# Patient Record
Sex: Male | Born: 1975 | Race: White | Hispanic: No | Marital: Married | Smoking: Light tobacco smoker
Health system: Southern US, Community
[De-identification: ages and names within clinical notes are randomized; demographics above are authoritative.]

## PROBLEM LIST (undated history)

## (undated) DIAGNOSIS — K429 Umbilical hernia without obstruction or gangrene: Secondary | ICD-10-CM

## (undated) DIAGNOSIS — T7840XA Allergy, unspecified, initial encounter: Secondary | ICD-10-CM

## (undated) DIAGNOSIS — R51 Headache: Secondary | ICD-10-CM

## (undated) DIAGNOSIS — E0590 Thyrotoxicosis, unspecified without thyrotoxic crisis or storm: Secondary | ICD-10-CM

## (undated) DIAGNOSIS — E0500 Thyrotoxicosis with diffuse goiter without thyrotoxic crisis or storm: Secondary | ICD-10-CM

## (undated) DIAGNOSIS — E119 Type 2 diabetes mellitus without complications: Secondary | ICD-10-CM

## (undated) DIAGNOSIS — R519 Headache, unspecified: Secondary | ICD-10-CM

## (undated) DIAGNOSIS — R011 Cardiac murmur, unspecified: Secondary | ICD-10-CM

## (undated) DIAGNOSIS — F419 Anxiety disorder, unspecified: Secondary | ICD-10-CM

## (undated) DIAGNOSIS — I10 Essential (primary) hypertension: Secondary | ICD-10-CM

## (undated) DIAGNOSIS — R454 Irritability and anger: Secondary | ICD-10-CM

## (undated) DIAGNOSIS — G4730 Sleep apnea, unspecified: Secondary | ICD-10-CM

## (undated) HISTORY — DX: Essential (primary) hypertension: I10

## (undated) HISTORY — DX: Type 2 diabetes mellitus without complications: E11.9

## (undated) HISTORY — DX: Thyrotoxicosis, unspecified without thyrotoxic crisis or storm: E05.90

## (undated) HISTORY — DX: Thyrotoxicosis with diffuse goiter without thyrotoxic crisis or storm: E05.00

## (undated) HISTORY — DX: Anxiety disorder, unspecified: F41.9

## (undated) HISTORY — DX: Headache, unspecified: R51.9

## (undated) HISTORY — DX: Cardiac murmur, unspecified: R01.1

## (undated) HISTORY — DX: Headache: R51

## (undated) HISTORY — DX: Irritability and anger: R45.4

## (undated) HISTORY — DX: Umbilical hernia without obstruction or gangrene: K42.9

## (undated) HISTORY — DX: Allergy, unspecified, initial encounter: T78.40XA

## (undated) HISTORY — PX: NO PAST SURGERIES: SHX2092

---

## 2012-09-24 LAB — TSH: TSH: 0 u[IU]/mL — AB (ref <=5.90)

## 2014-07-13 LAB — TSH: TSH: 0 u[IU]/mL — AB (ref 0.41–5.90)

## 2014-08-12 ENCOUNTER — Encounter: Payer: Self-pay | Admitting: Internal Medicine

## 2014-10-08 ENCOUNTER — Encounter: Payer: Self-pay | Admitting: Endocrinology

## 2014-10-08 ENCOUNTER — Ambulatory Visit (INDEPENDENT_AMBULATORY_CARE_PROVIDER_SITE_OTHER): Payer: 59 | Admitting: Endocrinology

## 2014-10-08 VITALS — BP 138/76 | HR 76 | Wt 218.5 lb

## 2014-10-08 DIAGNOSIS — I10 Essential (primary) hypertension: Secondary | ICD-10-CM | POA: Insufficient documentation

## 2014-10-08 DIAGNOSIS — E0590 Thyrotoxicosis, unspecified without thyrotoxic crisis or storm: Secondary | ICD-10-CM

## 2014-10-08 DIAGNOSIS — I152 Hypertension secondary to endocrine disorders: Secondary | ICD-10-CM | POA: Insufficient documentation

## 2014-10-08 NOTE — Progress Notes (Signed)
Pre visit review using our clinic review tool, if applicable. No additional management support is needed unless otherwise documented below in the visit note. 

## 2014-10-08 NOTE — Progress Notes (Signed)
Reason for visit: Low TSH/hyperthyroidism HPI  Edward Hurley is a 38 y.o.-year-old male, referred by his PCP,  Edward Hurley,Edward Hurley, Edward Hurley, for evaluation for hyperthyroidism/low TSH.   The patient denies any prior  family history of thyroid disorders. he denies any personal XRT exposure to his neck area. Patient denies any recent hospitalization, illness or major change in his medical history. No recent medication changes. Denies tenderness in neck area.  Denies any recent ingestion of seaweed/kelp or recent exposure to iv contrast dye. No recent steroid use or use of herbal supplements.   He gives about a 2 year history since 2013 of having symptoms related to the thyroid. About December 2013, he [facility] endocrine [facility], but "they messed up my insurance" and hasn't been back to see them. He reports being symptomatic with unintentional weight loss, tremors at that time for a period of close to 3 months. Labs were consistent with hyperthyroidism and he was started on methimazole by endocrine. Took that for about 1 month or so, but didn't go back for follow up. Hasn't been on medication/methimazole for the past 2 years. Recently went to see PCP, and screening TSH was abnormal.  Don't have those reports for review.  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    I reviewed pt's thyroid tests: No results found for: TSH, FREET4  Other pertinent labs are as follows: No results found for: AST No results found for: ALT  Review of systems:  [ x ] complains of   [  ] denies  [  ] weight loss [ x ]  Tremors, contd but better [  ] palpitations  [  ] diarrhea [  ] increased anxiety [  ] muscle weakness  [  ] heat intolerance [  ] fatigue  [  ] proptosis [  ] problems with eye closure or color vision. [  ] noticing any enlargement in size of thyroid [  ] lumps in neck [  ] dysphagia  [  ] change in voice [  ]  SOB when lying down   I reviewed his chart and he also has a history of HTN for which he is on  Atenolol. I have reviewed the patient's past medical history, family and social history, surgical history, medications and allergies.  Past Medical History  Diagnosis Date  . Hypertension   . Irritability    No past surgical history on file. Family History  Problem Relation Age of Onset  . Thyroid disease Neg Hx    History   Social History  . Marital Status: Married    Spouse Name: N/A    Number of Children: N/A  . Years of Education: N/A   Occupational History  . Not on file.   Social History Main Topics  . Smoking status: Current Some Day Smoker  . Smokeless tobacco: Not on file  . Alcohol Use: 0.0 oz/week    0 Not specified per week  . Drug Use: No  . Sexual Activity: Not on file   Other Topics Concern  . Not on file   Social History Narrative  . No narrative on file   No current outpatient prescriptions on file prior to visit.   No current facility-administered medications on file prior to visit.   No Known Allergies     ROS: Review of Systems: [x]  complains of  [  ] denies General:   [  ] Recent weight change [  ] Fatigue  [  ]  Loss of appetite Eyes: [  ]  Vision Difficulty [  ]  Eye pain ENT: [  ]  Hearing difficulty [  ]  Difficulty Swallowing CVS: [  ] Chest pain [  ]  Palpitations/Irregular Heart beat [  ]  Shortness of breath lying flat [  ] Swelling of legs Resp: [  ] Frequent Cough [  ] Shortness of Breath  [  ]  Wheezing GI: [  ] Heartburn  [  ] Nausea or Vomiting  [  ] Diarrhea [  ] Constipation  [  ] Abdominal Pain GU: [  ]  Polyuria  [  ]  nocturia Bones/joints:  [  ]  Muscle aches  [  ] Joint Pain  [  ] Bone pain Skin/Hair/Nails: [  ]  Rash  [  ] New stretch marks [  ]  Itching [  ] Hair loss [  ]  Excessive hair growth Reproduction: [  ] Low sexual desire , [  ]  Women: Menstrual cycle problems [  ]  Women: Breast Discharge [  ] Men: Difficulty with erections [  ]  Men: Enlarged Breasts CNS: [  ] Frequent Headaches [  ] Blurry vision [x  ]  Tremors [  ] Seizures [  ] Loss of consciousness [  ] Localized weakness Endocrine: [  ]  Excess thirst [  ]  Feeling excessively hot [  ]  Feeling excessively cold Heme: [  ]  Easy bruising [  ]  Enlarged glands or lumps in neck Allergy: [  ]  Food allergies [ x ] Environmental allergies  PE: BP 138/76 mmHg  Pulse 76  Wt 218 lb 8 oz (99.111 kg)  SpO2 97% Wt Readings from Last 3 Encounters:  10/08/14 218 lb 8 oz (99.111 kg)    HEENT: [place]/AT, EOMI, no icterus, no proptosis, no chemosis, no mild lid lag, no retraction, eyes close completely Neck: thyroid gland - smooth with some minor irregularity, non-tender, no erythema, no tracheal deviation; negative Pemberton's sign; no lymphadenopathy; no bruits Lungs: good air entry, clear bilaterally Heart: S1&S2 normal, regular rate & rhythm; no murmurs, rubs or gallops Abd: soft, NT, ND, no HSM, +BS Ext: min tremor in hands bilaterally, no edema, 2+ DP/PT pulses, good muscle mass Neuro: normal gait, 2+ reflexes bilaterally, normal 5/5 strength, no proximal myopathy  Derm: no pretibial myxoedema/skin dryness   ASSESSMENT: 1. Low TSH/Hyperthyroidism   PLAN:    Problem List Items Addressed This Visit      Endocrine   Hyperthyroidism - Primary    Discussed with the patient regarding thyroid hormone physiology, symptoms and signs of hyperthyroidism. Discussed possible etiologies for low TSH could indicate early subclinical hyperthyroidism due to Graves disease versus toxic adenoma or toxic MNG versus transient changes due to thyroiditis.   Will obtain prior thyroid records and referral notes from PCP.  Will get a thyroid panel today with thyroid antibody levels.   On exam today he appears to be clinically euthyroid/slightly overactive without a goiter/palpable nodules. his heart rate is well controlled today, will hold off on starting beta blocker for now.   If labs are consistent with subclinical hyperthyroidism- then will continue to  monitor for symptoms at this time.  If labs show frank hyperthyroidism- then will conduct further workup with diagnostic nuclear scan/thyroid US and consider starting the patient back on methimazole.   We discussed the diagnostic workup and treatment options for hyperthyroidism at this  visit.   RTC 2 months    Relevant Medications      atenolol (TENORMIN) 100 MG tablet   Other Relevant Orders      Thyroid stimulating immunoglobulin      Thyroid Profile II      Hepatic function panel      T4, free      T3, free      - I advised him to join my chart to communicate easier - RTC in 2 months or sooner if symptoms worsen.     Marke Goodwyn [facility] 10/08/2014  3:21 PM

## 2014-10-08 NOTE — Patient Instructions (Signed)
Labs today.  Monitor for symptoms of hyperthyroidism discussed in clinic.   Heart rate well controlled today.  Please come back for a follow-up appointment in 2 months.

## 2014-10-08 NOTE — Assessment & Plan Note (Signed)
Discussed with the patient regarding thyroid hormone physiology, symptoms and signs of hyperthyroidism. Discussed possible etiologies for low TSH could indicate early subclinical hyperthyroidism due to Graves disease versus toxic adenoma or toxic MNG versus transient changes due to thyroiditis.   Will obtain prior thyroid records and referral notes from PCP.  Will get a thyroid panel today with thyroid antibody levels.   On exam today he appears to be clinically euthyroid/slightly overactive without a goiter/palpable nodules. his heart rate is well controlled today, will hold off on starting beta blocker for now.   If labs are consistent with subclinical hyperthyroidism- then will continue to monitor for symptoms at this time.  If labs show frank hyperthyroidism- then will conduct further workup with diagnostic nuclear scan/thyroid US and consider starting the patient back on methimazole.   We discussed the diagnostic workup and treatment options for hyperthyroidism at this visit.   RTC 2 months

## 2014-10-09 ENCOUNTER — Other Ambulatory Visit: Payer: Self-pay | Admitting: Endocrinology

## 2014-10-09 DIAGNOSIS — E0590 Thyrotoxicosis, unspecified without thyrotoxic crisis or storm: Secondary | ICD-10-CM

## 2014-10-09 LAB — T4, FREE: Free T4: 2.86 ng/dL — ABNORMAL HIGH (ref 0.60–1.60)

## 2014-10-09 LAB — THYROID PROFILE II
Free Thyroxine Index: 6 — ABNORMAL HIGH (ref 1.2–4.9)
T3 Uptake Ratio: 39 % (ref 24–39)
T3, Total: 267 ng/dL — ABNORMAL HIGH (ref 71–180)
T4, Total: 15.5 ug/dL — ABNORMAL HIGH (ref 4.5–12.0)
TSH: 0.005 u[IU]/mL — ABNORMAL LOW (ref 0.450–4.500)

## 2014-10-09 LAB — HEPATIC FUNCTION PANEL
ALBUMIN: 4.5 g/dL (ref 3.5–5.2)
ALK PHOS: 69 U/L (ref 39–117)
ALT: 40 U/L (ref 0–53)
AST: 25 U/L (ref 0–37)
Bilirubin, Direct: 0.1 mg/dL (ref 0.0–0.3)
TOTAL PROTEIN: 7.2 g/dL (ref 6.0–8.3)
Total Bilirubin: 0.7 mg/dL (ref 0.2–1.2)

## 2014-10-09 LAB — T3, FREE: T3, Free: 7.2 pg/mL — ABNORMAL HIGH (ref 2.3–4.2)

## 2014-10-10 ENCOUNTER — Other Ambulatory Visit: Payer: Self-pay | Admitting: Endocrinology

## 2014-10-10 ENCOUNTER — Encounter: Payer: Self-pay | Admitting: Endocrinology

## 2014-10-10 DIAGNOSIS — E0590 Thyrotoxicosis, unspecified without thyrotoxic crisis or storm: Secondary | ICD-10-CM

## 2014-10-10 DIAGNOSIS — J309 Allergic rhinitis, unspecified: Secondary | ICD-10-CM | POA: Insufficient documentation

## 2014-10-10 DIAGNOSIS — F419 Anxiety disorder, unspecified: Secondary | ICD-10-CM

## 2014-10-10 DIAGNOSIS — F329 Major depressive disorder, single episode, unspecified: Secondary | ICD-10-CM | POA: Insufficient documentation

## 2014-10-10 DIAGNOSIS — F32A Depression, unspecified: Secondary | ICD-10-CM | POA: Insufficient documentation

## 2014-10-11 LAB — THYROID STIMULATING IMMUNOGLOBULIN: TSI: 242 %{baseline} — AB (ref <140)

## 2014-10-24 ENCOUNTER — Telehealth: Payer: Self-pay | Admitting: Endocrinology

## 2014-10-24 NOTE — Telephone Encounter (Signed)
error 

## 2014-11-07 ENCOUNTER — Telehealth: Payer: Self-pay

## 2014-11-07 NOTE — Telephone Encounter (Signed)
-----   Message from Quentin Cornwalladhika P Phadke, MD sent at 11/06/2014 12:57 PM EST ----- Regarding: FW: fu nuc scan Could you call to see whether he got his nuclear scan- ideally would like to have the results before leaving for vacation. thanks ----- Message -----    From: Quentin Cornwalladhika P Phadke, MD    Sent: 10/09/2014   9:47 AM      To: Quentin Cornwalladhika P Phadke, MD Subject: fu nuc scan

## 2014-11-07 NOTE — Telephone Encounter (Signed)
Patient was scheduled to have it done 10/25/14. Patient did not show up for appointment and has not called to reschedule the appointment yet.

## 2014-12-03 ENCOUNTER — Encounter: Payer: Self-pay | Admitting: Endocrinology

## 2014-12-10 ENCOUNTER — Ambulatory Visit: Payer: Self-pay | Admitting: Endocrinology

## 2014-12-11 ENCOUNTER — Telehealth: Payer: Self-pay

## 2014-12-11 NOTE — Telephone Encounter (Signed)
Called pt to follow up on missed apt, no answer, lvmom (X2)

## 2014-12-11 NOTE — Telephone Encounter (Signed)
Lets wait for this week and see whether he keeps up with his nuc med appointment. If he doesn't keep this appointment, then I will talk to him regarding whether he wishes to follow up.

## 2014-12-12 ENCOUNTER — Ambulatory Visit: Payer: Self-pay | Admitting: Endocrinology

## 2014-12-12 NOTE — Telephone Encounter (Signed)
Please could you on Monday check with [facility] Nuc med to see if the patient made it to his nuc med diagnostic scan for his thyroid.

## 2014-12-16 NOTE — Telephone Encounter (Signed)
Spoke to patient to schedule a follow up appt. Patient stated that he was at work and unable to schedule the appointment right now. Patient stated she would call tomorrow jto schedule lab appt and/or follow up this week.

## 2014-12-16 NOTE — Telephone Encounter (Signed)
Patient schedule Wednesday at 11:15am for follow up

## 2014-12-16 NOTE — Telephone Encounter (Signed)
Patient did have his nuc scan done. Results have been placed in your box for review.

## 2014-12-16 NOTE — Telephone Encounter (Signed)
Great, could you bring him back for an appt for me as well- preferably this week. If he cant come in this week, I would like him to atleast have repeat labs this week and follow up next week ( Thyroid profile II, free T3, free T4, Liver panel) Dx hyperthyroidism. I will review the Nuc scan results tomorrow and let him know of the results.   Thanks

## 2014-12-17 ENCOUNTER — Telehealth: Payer: Self-pay

## 2014-12-17 NOTE — Telephone Encounter (Signed)
Test results given to Dr. Welford RochePhadke. Awaiting result note at this time.

## 2014-12-17 NOTE — Telephone Encounter (Signed)
Notified patient of results and Dr. Ephriam JenkinsPhadke's comments. Patient verbalized understanding.

## 2014-12-17 NOTE — Telephone Encounter (Signed)
Called patient, no answer. Left voicemail requesting a callback as soon as possible to discuss results.

## 2014-12-17 NOTE — Telephone Encounter (Signed)
The patient called hoping to get a return call regarding his test results.  (He has an apt scheduled for tomorrow)

## 2014-12-17 NOTE — Telephone Encounter (Signed)
Spoke to patient to notify him of dr.Phadke's comments. Patient verbalized understanding and stated that he would be here for his appt tomorrow.

## 2014-12-17 NOTE — Telephone Encounter (Signed)
Reviewed his Nuc Scan results done [facility] on 12/13/14-  4 hour uptake elevated at 81% 24 hour uptake elevated at 86%  Normal homogenous tracer distribution throughout both thyroid lobes. No focal areas of increased or decreased tracer localization.   Impression- This indicates that he has Graves' disease ( autoimmune thyroid disorder where the body's immunity causes the thyroid to continue to overwork). I would like him to keep his appointment tomorrow with me so that we could review treatment options and get labs tomorrow.   Please could you let him know of the above.  Thanks

## 2014-12-17 NOTE — Telephone Encounter (Signed)
Called patient, no answer. Left voicemail requesting a callback to discuss results.

## 2014-12-18 ENCOUNTER — Ambulatory Visit (INDEPENDENT_AMBULATORY_CARE_PROVIDER_SITE_OTHER): Payer: Managed Care, Other (non HMO) | Admitting: Endocrinology

## 2014-12-18 ENCOUNTER — Encounter: Payer: Self-pay | Admitting: Endocrinology

## 2014-12-18 VITALS — BP 122/62 | HR 90 | Ht 72.0 in | Wt 211.0 lb

## 2014-12-18 DIAGNOSIS — E0590 Thyrotoxicosis, unspecified without thyrotoxic crisis or storm: Secondary | ICD-10-CM

## 2014-12-18 LAB — HEPATIC FUNCTION PANEL
ALBUMIN: 4.2 g/dL (ref 3.5–5.2)
ALK PHOS: 80 U/L (ref 39–117)
ALT: 33 U/L (ref 0–53)
AST: 25 U/L (ref 0–37)
Bilirubin, Direct: 0.1 mg/dL (ref 0.0–0.3)
TOTAL PROTEIN: 6.9 g/dL (ref 6.0–8.3)
Total Bilirubin: 0.5 mg/dL (ref 0.2–1.2)

## 2014-12-18 LAB — T3, FREE: T3, Free: 21.2 pg/mL — ABNORMAL HIGH (ref 2.3–4.2)

## 2014-12-18 LAB — T4, FREE: FREE T4: 4.65 ng/dL — AB (ref 0.60–1.60)

## 2014-12-18 MED ORDER — ATENOLOL 25 MG PO TABS: ORAL_TABLET | ORAL | Status: DC

## 2014-12-18 NOTE — Patient Instructions (Signed)
Labs today, following this will start methimazole for hyperthyroidism. Notify me if you start to have fever/sore throat/rash/skin yellowing while on this medication.   Increase atenolol to 125 mg daily.   Please come back for a follow-up appointment in 6 weeks

## 2014-12-18 NOTE — Assessment & Plan Note (Signed)
He has uncontrolled hyperthyroidism from Graves'  Update thyroid panel today to assess trend.  Adjusted his beta blocker to 125 mg daily. He is aware to notify me if he does not tolerate the increased dose of BB due to hypotension , bradycardia.   Discussed at length regarding definitive treatment for hyperthyroidism ( methimazole/PTU vs RAI vs surgery). Discussed pros and cons at length. Discussed common side effects and expected response.   He has elected to start methimazole - will base dose on his labs from today. He will notify me if he doesn't tolerate or develops any side effects from methimazole.  Also discussed about Grave's opthalmopathy and increased risk in smokers. Discussed about smoking cessation briefly.   RTC 6 weeks

## 2014-12-18 NOTE — Progress Notes (Signed)
Reason for visit: Low TSH/hyperthyroidism HPI  Edward Hurley is a 39 y.o.-year-old male, here for follow up of hyperthyroidism/low TSH.   He gives about a 2 year history since 2013 of having symptoms related to the thyroid. About December 2013, he saw endocrine [facility] when he was symptomatic with unintentional weight loss, tremors at that time for a period of close to 3 months. Labs were consistent with hyperthyroidism and he was started on methimazole by endocrine. Took that for about 1 month or so, but didn't go back for follow up. Hasn't been on medication/methimazole for the past 2 years.  Went to see PCP in 2015, and screening TSH was abnormal.  Follow up testing in Nov 2015 showed continued hyperthyroidism, with elevated TSI levels.  He scheduled and rescheduled appointments for diagnostic nuclear scan and finally got the test done in jan 2016 that was consistent with increased 4 and 24 hour uptake, which was homogenous throughout the thyroid lobes c/w Graves' . Recent results d/w pt today.  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    I reviewed pt's thyroid tests: Lab Results  Component Value Date   TSH 0.005* 10/08/2014   TSH 0.00* 07/13/2014   TSH 0.00* 09/24/2012   FREET4 2.86* 10/08/2014    Lab Results  Component Value Date   T3FREE 7.2* 10/08/2014    Other pertinent labs are as follows: Lab Results  Component Value Date   AST 25 10/08/2014   Lab Results  Component Value Date   ALT 40 10/08/2014    Review of systems:  [ x ] complains of   [  ] denies  [ x ] weight loss [ x ]  Tremors, contd but better [  ] palpitations  [  ] diarrhea [  ] increased anxiety [  ] muscle weakness  [ x ] heat intolerance [  ] fatigue  [  ] proptosis [  ] problems with eye closure or color vision. [  ] noticing any enlargement in size of thyroid [  ] lumps in neck [  ] dysphagia  [  ] change in voice [  ]  SOB when lying down  Still smoking 1 cig/day. No leg  swelling. Continues to beta blocker and tolerating well.   I reviewed his chart and he also has a history of HTN for which he is on Atenolol. I have reviewed the patient's past medical history,  medications and allergies.   Current Outpatient Prescriptions on File Prior to Visit  Medication Sig Dispense Refill  . atenolol (TENORMIN) 100 MG tablet Take 100 mg by mouth daily.    . sertraline (ZOLOFT) 100 MG tablet Take 100 mg by mouth daily.     No current facility-administered medications on file prior to visit.   No Known Allergies  PE: BP 122/62 mmHg  Pulse 90  Ht 6' (1.829 m)  Wt 211 lb (95.709 kg)  BMI 28.61 kg/m2  SpO2 97% Wt Readings from Last 3 Encounters:  12/18/14 211 lb (95.709 kg)  10/08/14 218 lb 8 oz (99.111 kg)    HEENT: [place]/AT, EOMI, no icterus, no proptosis, no chemosis, no mild lid lag, no retraction, eyes close completely Neck: thyroid gland - smooth with some minor irregularity, non-tender, no erythema, no tracheal deviation; negative Pemberton's sign; no lymphadenopathy; no bruits Lungs: good air entry, clear bilaterally Heart: S1&S2 normal, regular rate & rhythm; no murmurs, rubs or gallops Ext: + tremor in hands bilaterally, no edema,  2+ DP/PT pulses, good muscle mass Neuro: normal gait, 2+ reflexes bilaterally, normal 5/5 strength, no proximal myopathy  Derm: no pretibial myxoedema/skin dryness   ASSESSMENT: 1. Low TSH/Hyperthyroidism from Graves' Reviewed his Nuc Scan results done [facility] on 12/13/14-  4 hour uptake elevated at 81%, 24 hour uptake elevated at 86%  Normal homogenous tracer distribution throughout both thyroid lobes. No focal areas of increased or decreased tracer localization.   Impression- This indicates that he has Graves' disease   PLAN:    Problem List Items Addressed This Visit      Endocrine   Hyperthyroidism - Primary    He has uncontrolled hyperthyroidism from Graves'  Update thyroid panel today to assess trend.   Adjusted his beta blocker to 125 mg daily. He is aware to notify me if he does not tolerate the increased dose of BB due to hypotension , bradycardia.   Discussed at length regarding definitive treatment for hyperthyroidism ( methimazole/PTU vs RAI vs surgery). Discussed pros and cons at length. Discussed common side effects and expected response.   He has elected to start methimazole - will base dose on his labs from today. He will notify me if he doesn't tolerate or develops any side effects from methimazole.  Also discussed about Grave's opthalmopathy and increased risk in smokers. Discussed about smoking cessation briefly.   RTC 6 weeks        Relevant Medications   atenolol (TENORMIN) tablet   Other Relevant Orders   T4, free   T3, free   Thyroid Profile II   Hepatic function panel       - RTC in 6 weeks or sooner if symptoms worsen.  25 minutes spent with the patient, >50% time spent on discussion of diagnosis, compliance, treatment options of hyperthyroidism.     Teyla Skidgel [facility] 12/18/2014  1:33 PM

## 2014-12-18 NOTE — Progress Notes (Signed)
Pre visit review using our clinic review tool, if applicable. No additional management support is needed unless otherwise documented below in the visit note. 

## 2014-12-19 ENCOUNTER — Other Ambulatory Visit: Payer: Self-pay | Admitting: Endocrinology

## 2014-12-19 DIAGNOSIS — E0590 Thyrotoxicosis, unspecified without thyrotoxic crisis or storm: Secondary | ICD-10-CM

## 2014-12-19 LAB — THYROID PROFILE II
Free Thyroxine Index: 8.2 — ABNORMAL HIGH (ref 1.2–4.9)
T3 UPTAKE RATIO: 42 % — AB (ref 24–39)
T3, Total: 599 ng/dL — ABNORMAL HIGH (ref 71–180)
T4, Total: 19.5 ug/dL (ref 4.5–12.0)
TSH: 0.005 u[IU]/mL — ABNORMAL LOW (ref 0.450–4.500)

## 2014-12-19 MED ORDER — METHIMAZOLE 10 MG PO TABS: 30.0000 mg | ORAL_TABLET | Freq: Every day | ORAL | Status: DC

## 2014-12-24 ENCOUNTER — Encounter: Payer: Self-pay | Admitting: Endocrinology

## 2015-01-08 ENCOUNTER — Other Ambulatory Visit: Payer: Self-pay | Admitting: Endocrinology

## 2015-01-08 DIAGNOSIS — E0590 Thyrotoxicosis, unspecified without thyrotoxic crisis or storm: Secondary | ICD-10-CM

## 2015-01-29 ENCOUNTER — Ambulatory Visit: Payer: Self-pay | Admitting: Endocrinology

## 2015-01-29 ENCOUNTER — Telehealth: Payer: Self-pay

## 2015-01-29 NOTE — Telephone Encounter (Signed)
Called the patient to determine the reason for the no show, no answer.  Lvmom, waiting on call back.

## 2015-01-29 NOTE — Telephone Encounter (Signed)
Okay, if he doesn't call back- could we try to talk to him tomorrow. May be try calling his work place.  Okay to charge noshow due to his history of not keeping appts.  thanks

## 2015-01-30 ENCOUNTER — Ambulatory Visit (INDEPENDENT_AMBULATORY_CARE_PROVIDER_SITE_OTHER): Payer: Managed Care, Other (non HMO) | Admitting: Endocrinology

## 2015-01-30 ENCOUNTER — Encounter: Payer: Self-pay | Admitting: Endocrinology

## 2015-01-30 VITALS — BP 130/78 | HR 87 | Resp 14 | Ht 72.0 in | Wt 215.5 lb

## 2015-01-30 DIAGNOSIS — I10 Essential (primary) hypertension: Secondary | ICD-10-CM

## 2015-01-30 DIAGNOSIS — E0590 Thyrotoxicosis, unspecified without thyrotoxic crisis or storm: Secondary | ICD-10-CM

## 2015-01-30 LAB — HEPATIC FUNCTION PANEL
ALBUMIN: 4.2 g/dL (ref 3.5–5.2)
ALK PHOS: 94 U/L (ref 39–117)
ALT: 32 U/L (ref 0–53)
AST: 19 U/L (ref 0–37)
BILIRUBIN TOTAL: 0.5 mg/dL (ref 0.2–1.2)
Bilirubin, Direct: 0.1 mg/dL (ref 0.0–0.3)
TOTAL PROTEIN: 6.6 g/dL (ref 6.0–8.3)

## 2015-01-30 LAB — T4, FREE: Free T4: 2.03 ng/dL — ABNORMAL HIGH (ref 0.60–1.60)

## 2015-01-30 LAB — T3, FREE: T3 FREE: 8.1 pg/mL — AB (ref 2.3–4.2)

## 2015-01-30 NOTE — Assessment & Plan Note (Signed)
BP at target on current regimen. Tolerating Atenolol well

## 2015-01-30 NOTE — Assessment & Plan Note (Signed)
He has uncontrolled hyperthyroidism from Graves'. Started on methimazole 5- 6 weeks ago. Tolerating well.   Update thyroid panel today and adjust dose of methimazole based on results. .  Continue current BB.  Discussed at length various treatment modalities for hyperthyroidism at last visit. He wishes to continue methimazole at this time.   Encouraged to find another hobby to destress- and quit smoking.

## 2015-01-30 NOTE — Progress Notes (Signed)
Pre visit review using our clinic review tool, if applicable. No additional management support is needed unless otherwise documented below in the visit note. 

## 2015-01-30 NOTE — Patient Instructions (Signed)
Labs today. Adjust dose of methimazole based on levels.   Please come back for a follow-up appointment in 2 months.

## 2015-01-30 NOTE — Progress Notes (Signed)
Patient ID: Edward Hurley, male   DOB: 12-20-1975, 39 y.o.   MRN: [id number]    Reason for visit: Low TSH/hyperthyroidism HPI  Edward Hurley is a 39 y.o.-year-old male, here for follow up of hyperthyroidism/low TSH.   He gives about a 2 year history since 2013 of having symptoms related to the thyroid. About December 2013, he saw endocrine at [facility] when he was symptomatic with unintentional weight loss, tremors at that time for a period of close to 3 months. Labs were consistent with hyperthyroidism and he was started on methimazole by endocrine. Took that for about 1 month or so, but didn't go back for follow up. Hasn't been on medication/methimazole for 2 years ( 2013-15).  Went to see PCP in 2015, and screening TSH was abnormal.  Follow up testing in Nov 2015 showed continued hyperthyroidism, with elevated TSI levels.  Started on methimazole 30 mg daily jan 2016. Tolerating well. Missed about 2 days doses at the time of refill, otherwise compliant. Recently had episodes of sore throat intermittently with laryngitis, got WBC checked with PCP and was reportedly normal. Symptoms resolved now for past few weeks. Took Prednisone for about 5 days , finished course 3 weeks ago.  Has noticed improvement in his original symptoms- weight loss, tremors, muscle weakness, insomnia since start of  MMI.     I reviewed pt's thyroid tests: Lab Results  Component Value Date   TSH 0.005* 12/18/2014   TSH 0.005* 10/08/2014   TSH 0.00* 07/13/2014   TSH 0.00* 09/24/2012   FREET4 4.65* 12/18/2014   FREET4 2.86* 10/08/2014    Lab Results  Component Value Date   T3FREE 21.2* 12/18/2014   T3FREE 7.2* 10/08/2014    Other pertinent labs are as follows: Lab Results  Component Value Date   AST 25 12/18/2014   Lab Results  Component Value Date   ALT 33 12/18/2014    Review of systems:  [ x ] complains of   [  ] denies  [  ] weight loss [  x]  Tremors, contd but much better [  ]  palpitations  [  ] diarrhea [  ] increased anxiety [  ] muscle weakness  [ x ] heat intolerance- much better [  ] fatigue  [  ] proptosis [  ] problems with eye closure or color vision. [  ] noticing any enlargement in size of thyroid [  ] lumps in neck [  ] dysphagia  [  ] change in voice [  ]  SOB when lying down  Still smoking 1 cig/day. No leg swelling. Continues to beta blocker and tolerating well.   I reviewed his chart and he also has a history of HTN for which he is on Atenolol. I have reviewed the patient's past medical history,  medications and allergies.   Current Outpatient Prescriptions on File Prior to Visit  Medication Sig Dispense Refill  . atenolol (TENORMIN) 100 MG tablet Take 100 mg by mouth daily.    Marland Kitchen atenolol (TENORMIN) 25 MG tablet In addition to 100 mg for a total daily dose of 125 mg daily 30 tablet 3  . methimazole (TAPAZOLE) 10 MG tablet Take 3 tablets (30 mg total) by mouth daily. 90 tablet 3  . sertraline (ZOLOFT) 100 MG tablet Take 100 mg by mouth daily.     No current facility-administered medications on file prior to visit.   No Known Allergies  PE: BP 130/78 mmHg  Pulse  87  Resp 14  Ht 6' (1.829 m)  Wt 215 lb 8 oz (97.75 kg)  BMI 29.22 kg/m2  SpO2 98% Wt Readings from Last 3 Encounters:  01/30/15 215 lb 8 oz (97.75 kg)  12/18/14 211 lb (95.709 kg)  10/08/14 218 lb 8 oz (99.111 kg)    HEENT: [place]/AT, EOMI, no icterus, no proptosis, no chemosis, no mild lid lag, no retraction, eyes close completely Neck: thyroid gland - smooth with some minor irregularity and slight enlargement, non-tender, no erythema, no tracheal deviation; negative Pemberton's sign; no lymphadenopathy; no bruits Lungs: good air entry, clear bilaterally Heart: S1&S2 normal, regular rate & rhythm; no murmurs, rubs or gallops Ext: + tremor in hands bilaterally, no edema, 2+ DP/PT pulses, good muscle mass Neuro: normal gait, 2+ reflexes bilaterally, normal 5/5 strength, no  proximal myopathy  Derm: no pretibial myxoedema/skin dryness   ASSESSMENT: 1. Low TSH/Hyperthyroidism from Graves' Reviewed [facility]-  4 hour uptake elevated at 81%, 24 hour uptake elevated at 86%  Normal homogenous tracer distribution throughout both thyroid lobes. No focal areas of increased or decreased tracer localization.   Impression- This indicates that he has Graves' disease   PLAN:    Problem List Items Addressed This Visit      Cardiovascular and Mediastinum   Essential hypertension, benign    BP at target on current regimen. Tolerating Atenolol well       Relevant Orders   T4, free   T3, free   Thyroid Profile II   Hepatic function panel     Endocrine   Hyperthyroidism - Primary    He has uncontrolled hyperthyroidism from Graves'. Started on methimazole 5- 6 weeks ago. Tolerating well.   Update thyroid panel today and adjust dose of methimazole based on results. .  Continue current BB.  Discussed at length various treatment modalities for hyperthyroidism at last visit. He wishes to continue methimazole at this time.   Encouraged to find another hobby to destress- and quit smoking.            Relevant Orders   T4, free   T3, free   Thyroid Profile II   Hepatic function panel       - RTC in 2 months or sooner if symptoms worsen.      Leanna Hamid [address] [facility] 01/30/2015  2:38 PM

## 2015-01-31 ENCOUNTER — Other Ambulatory Visit: Payer: Self-pay | Admitting: Endocrinology

## 2015-01-31 DIAGNOSIS — E0590 Thyrotoxicosis, unspecified without thyrotoxic crisis or storm: Secondary | ICD-10-CM

## 2015-01-31 LAB — THYROID PROFILE II
Free Thyroxine Index: 4.7 (ref 1.2–4.9)
T3 UPTAKE RATIO: 36 % (ref 24–39)
T3, Total: 268 ng/dL — ABNORMAL HIGH (ref 71–180)
T4, Total: 13 ug/dL — ABNORMAL HIGH (ref 4.5–12.0)
TSH: 0.005 u[IU]/mL — AB (ref 0.450–4.500)

## 2015-01-31 MED ORDER — METHIMAZOLE 10 MG PO TABS: 20.0000 mg | ORAL_TABLET | Freq: Every day | ORAL | Status: DC

## 2015-04-03 ENCOUNTER — Encounter: Payer: Self-pay | Admitting: Endocrinology

## 2015-04-03 ENCOUNTER — Ambulatory Visit (INDEPENDENT_AMBULATORY_CARE_PROVIDER_SITE_OTHER): Payer: Managed Care, Other (non HMO) | Admitting: Endocrinology

## 2015-04-03 VITALS — BP 132/80 | HR 78 | Resp 14 | Ht 72.0 in | Wt 220.2 lb

## 2015-04-03 DIAGNOSIS — E0590 Thyrotoxicosis, unspecified without thyrotoxic crisis or storm: Secondary | ICD-10-CM

## 2015-04-03 NOTE — Patient Instructions (Signed)
Continue current methimazole and atenolol.  Labs next week for dose adjustment.  Please come back for a follow-up appointment in 2 months.

## 2015-04-03 NOTE — Progress Notes (Signed)
Reason for visit: Low TSH/hyperthyroidism HPI  Edward PiedraMichael Galatia Hurley is a 39 y.o.-year-old male, here for follow up of hyperthyroidism/low TSH.   He gives about a 2 year history since 2013 of having symptoms related to the thyroid. About December 2013, [facility] when he was symptomatic with unintentional weight loss, tremors at that time for a period of close to 3 months. Labs were consistent with hyperthyroidism and he was started on methimazole by endocrine. Took that for about 1 month or so, but didn't go back for follow up. Hasn't been on medication/methimazole for 2 years ( 2013-15).  Went to see PCP in 2015, and screening TSH was abnormal.  Follow up testing in Nov 2015 showed continued hyperthyroidism, with elevated TSI levels.  Started on methimazole 30 mg daily jan 2016. Tolerating well. Missed about 2 days doses at the time of refill, otherwise compliant.   Has noticed improvement in his original symptoms- weight loss, tremors, muscle weakness, insomnia since start of  MMI.  At last visit in March 2016, he was asked to decrease dose of methimazole to 20 mg daily - he took this dose for about 1 week, but started to notice tremors and self increased dose to 30 mg daily. He has been doing this for about the past 4- 5 weeks. Tolerating well and feels that symptom control his good. Continues on Atenolol. Denies familial tremors.     I reviewed pt's thyroid tests: Lab Results  Component Value Date   TSH 0.005* 01/30/2015   TSH 0.005* 12/18/2014   TSH 0.005* 10/08/2014   TSH 0.00* 07/13/2014   TSH 0.00* 09/24/2012   FREET4 2.03* 01/30/2015   FREET4 4.65* 12/18/2014   FREET4 2.86* 10/08/2014    Lab Results  Component Value Date   T3FREE 8.1* 01/30/2015   T3FREE 21.2* 12/18/2014   T3FREE 7.2* 10/08/2014    Other pertinent labs are as follows: Lab Results  Component Value Date   AST 19 01/30/2015   Lab Results  Component Value Date   ALT 32  01/30/2015    Review of systems:  [ x ] complains of   [  ] denies  [  ] weight loss [  x]  Tremors, contd but much better [  ] palpitations  [  ] diarrhea [  ] increased anxiety [  ] muscle weakness  [ x ] heat intolerance- much better [  ] fatigue  [  ] proptosis [  ] problems with eye closure or color vision. [  ] noticing any enlargement in size of thyroid [  ] lumps in neck [  ] dysphagia  [  ] change in voice [  ]  SOB when lying down  Still smoking 1 cig/day. No leg swelling. Continues to beta blocker and tolerating well.   I reviewed his chart and he also has a history of HTN for which he is on Atenolol. I have reviewed the patient's past medical history,  medications and allergies.   Current Outpatient Prescriptions on File Prior to Visit  Medication Sig Dispense Refill  . atenolol (TENORMIN) 100 MG tablet Take 100 mg by mouth daily.    Marland Kitchen. atenolol (TENORMIN) 25 MG tablet In addition to 100 mg for a total daily dose of 125 mg daily 30 tablet 3  . clonazePAM (KLONOPIN) 0.5 MG tablet Take 0.5 mg by mouth daily as needed.   1  . methimazole (TAPAZOLE) 10 MG tablet Take 2 tablets (20  mg total) by mouth daily. (Patient taking differently: Take 30 mg by mouth daily. ) 60 tablet 3  . sertraline (ZOLOFT) 100 MG tablet Take 100 mg by mouth daily.     No current facility-administered medications on file prior to visit.   No Known Allergies  PE: BP 132/80 mmHg  Pulse 78  Resp 14  Ht 6' (1.829 m)  Wt 220 lb 4 oz (99.905 kg)  BMI 29.86 kg/m2  SpO2 96% Wt Readings from Last 3 Encounters:  04/03/15 220 lb 4 oz (99.905 kg)  01/30/15 215 lb 8 oz (97.75 kg)  12/18/14 211 lb (95.709 kg)    HEENT: [place]/AT, EOMI, no icterus, no proptosis, no chemosis, no mild lid lag, no retraction, eyes close completely Neck: thyroid gland - smooth with some minor irregularity and slight enlargement, non-tender, no erythema, no tracheal deviation; negative Pemberton's sign; no lymphadenopathy; no  bruits Lungs: good air entry, clear bilaterally Heart: S1&S2 normal, regular rate & rhythm; no murmurs, rubs or gallops Ext: + tremor in hands bilaterally, no edema, 2+ DP/PT pulses, good muscle mass Neuro: normal gait, 2+ reflexes bilaterally, normal 5/5 strength, no proximal myopathy  Derm: no pretibial myxoedema/skin dryness   ASSESSMENT: 1. Low TSH/Hyperthyroidism from Graves' Reviewed [facility]-  4 hour uptake elevated at 81%, 24 hour uptake elevated at 86%  Normal homogenous tracer distribution throughout both thyroid lobes. No focal areas of increased or decreased tracer localization.   Impression- This indicates that he has Graves' disease   PLAN:    Problem List Items Addressed This Visit      Endocrine   Hyperthyroidism - Primary    He has uncontrolled hyperthyroidism from Graves'. Started on methimazole March 2016. Tolerating well.   Update thyroid panel next week ( 6 weeks from last dose change) and adjust dose of methimazole based on results. Advised him not to self adjust dose of medication . Continue current dose for now.  Continue current BB.  Discussed at length various treatment modalities for hyperthyroidism at visit. He wishes to continue methimazole at this time.               Relevant Orders   Thyroid Profile II   T4, free   T3, free   Hepatic function panel       - RTC in 2 months or sooner if symptoms worsen.      Aayana Reinertsen [facility]KAR 04/04/2015  8:56 AM

## 2015-04-03 NOTE — Progress Notes (Signed)
Pre visit review using our clinic review tool, if applicable. No additional management support is needed unless otherwise documented below in the visit note. 

## 2015-04-04 NOTE — Assessment & Plan Note (Signed)
He has uncontrolled hyperthyroidism from Graves'. Started on methimazole March 2016. Tolerating well.   Update thyroid panel next week ( 6 weeks from last dose change) and adjust dose of methimazole based on results. Advised him not to self adjust dose of medication . Continue current dose for now.  Continue current BB.  Discussed at length various treatment modalities for hyperthyroidism at visit. He wishes to continue methimazole at this time.

## 2015-04-10 ENCOUNTER — Other Ambulatory Visit: Payer: Self-pay

## 2015-06-03 ENCOUNTER — Other Ambulatory Visit: Payer: Self-pay | Admitting: Endocrinology

## 2015-06-09 ENCOUNTER — Other Ambulatory Visit: Payer: Self-pay

## 2015-06-09 ENCOUNTER — Telehealth: Payer: Self-pay | Admitting: Family Medicine

## 2015-06-09 DIAGNOSIS — E0590 Thyrotoxicosis, unspecified without thyrotoxic crisis or storm: Secondary | ICD-10-CM

## 2015-06-09 NOTE — Telephone Encounter (Signed)
Was going to Endocrinology and the doctor has left the practice. Patient would like to know if you are able to refill his Methimazole (which he has not taken for about 1 week). Please send to [facility]

## 2015-06-09 NOTE — Telephone Encounter (Signed)
Wants refill was getting from endo but he states dr has left the practice?

## 2015-06-10 ENCOUNTER — Telehealth: Payer: Self-pay

## 2015-06-10 DIAGNOSIS — E0590 Thyrotoxicosis, unspecified without thyrotoxic crisis or storm: Secondary | ICD-10-CM

## 2015-06-10 MED ORDER — METHIMAZOLE 10 MG PO TABS: 30.0000 mg | ORAL_TABLET | Freq: Every day | ORAL | Status: DC

## 2015-06-10 NOTE — Telephone Encounter (Signed)
I do not treat hyperthyroidism, I can send 30 day but he will need to find another Endo.

## 2015-06-10 NOTE — Telephone Encounter (Signed)
Please read comment his endo dr. Left and wants to see if you will fill?

## 2015-06-19 ENCOUNTER — Other Ambulatory Visit: Payer: Self-pay | Admitting: Family Medicine

## 2015-06-19 NOTE — Telephone Encounter (Signed)
Patient requesting refill. 

## 2015-06-24 ENCOUNTER — Ambulatory Visit: Payer: Self-pay | Admitting: Endocrinology

## 2015-06-27 ENCOUNTER — Other Ambulatory Visit: Payer: Self-pay | Admitting: Family Medicine

## 2015-06-27 NOTE — Telephone Encounter (Signed)
Patient requesting refill. 

## 2015-07-05 ENCOUNTER — Encounter: Payer: Self-pay | Admitting: Family Medicine

## 2015-07-07 ENCOUNTER — Ambulatory Visit: Payer: Self-pay | Admitting: Family Medicine

## 2015-07-16 ENCOUNTER — Other Ambulatory Visit: Payer: Self-pay | Admitting: Family Medicine

## 2015-07-16 NOTE — Telephone Encounter (Signed)
Patient requesting refill. 

## 2015-07-17 ENCOUNTER — Other Ambulatory Visit: Payer: Self-pay

## 2015-07-17 ENCOUNTER — Other Ambulatory Visit: Payer: Self-pay | Admitting: Family Medicine

## 2015-07-17 ENCOUNTER — Other Ambulatory Visit: Payer: Self-pay | Admitting: *Deleted

## 2015-07-17 ENCOUNTER — Telehealth: Payer: Self-pay | Admitting: Endocrinology

## 2015-07-17 DIAGNOSIS — E0590 Thyrotoxicosis, unspecified without thyrotoxic crisis or storm: Secondary | ICD-10-CM

## 2015-07-17 MED ORDER — METHIMAZOLE 10 MG PO TABS: 30.0000 mg | ORAL_TABLET | Freq: Every day | ORAL | Status: DC

## 2015-07-17 NOTE — Telephone Encounter (Signed)
Please advise, Patient does not have an appointment scheduled with anyone at our office

## 2015-07-17 NOTE — Telephone Encounter (Signed)
rx sent

## 2015-07-17 NOTE — Telephone Encounter (Signed)
Left a message [facility]ic[facility] that Dr. Carlynn Purl does not fill this medication.

## 2015-07-17 NOTE — Telephone Encounter (Signed)
She can have 30 day supply without refills, needs to be seen ASAP as a 30 minute patient with labs same day

## 2015-07-17 NOTE — Telephone Encounter (Signed)
Former pt of Dr. Welford Roche  Pt needs refill on methimazole [facility] [address] [facility] [place]

## 2015-07-18 ENCOUNTER — Other Ambulatory Visit: Payer: Self-pay

## 2015-07-18 DIAGNOSIS — E0590 Thyrotoxicosis, unspecified without thyrotoxic crisis or storm: Secondary | ICD-10-CM

## 2015-07-21 ENCOUNTER — Telehealth: Payer: Self-pay

## 2015-07-21 NOTE — Telephone Encounter (Signed)
Left vm that she could not fill methimazole due to her not being the prescriber.

## 2015-08-15 ENCOUNTER — Other Ambulatory Visit: Payer: Self-pay | Admitting: Endocrinology

## 2015-08-15 NOTE — Telephone Encounter (Signed)
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 He does not have an appt scheduled.

## 2015-08-21 ENCOUNTER — Telehealth: Payer: Self-pay | Admitting: Endocrinology

## 2015-08-21 DIAGNOSIS — E0590 Thyrotoxicosis, unspecified without thyrotoxic crisis or storm: Secondary | ICD-10-CM

## 2015-08-21 MED ORDER — METHIMAZOLE 10 MG PO TABS: 30.0000 mg | ORAL_TABLET | Freq: Every day | ORAL | Status: DC

## 2015-08-21 NOTE — Telephone Encounter (Signed)
Please call in [facility] [place]

## 2015-09-08 ENCOUNTER — Ambulatory Visit: Payer: Self-pay | Admitting: Endocrinology

## 2015-09-08 DIAGNOSIS — Z0289 Encounter for other administrative examinations: Secondary | ICD-10-CM

## 2015-09-15 ENCOUNTER — Other Ambulatory Visit: Payer: Self-pay | Admitting: Family Medicine

## 2015-09-15 NOTE — Telephone Encounter (Signed)
Patient requesting refill. 

## 2015-10-13 ENCOUNTER — Other Ambulatory Visit: Payer: Self-pay | Admitting: Family Medicine

## 2015-10-23 ENCOUNTER — Ambulatory Visit: Payer: Self-pay | Admitting: Endocrinology

## 2015-10-23 DIAGNOSIS — Z0289 Encounter for other administrative examinations: Secondary | ICD-10-CM

## 2015-11-08 ENCOUNTER — Other Ambulatory Visit: Payer: Self-pay | Admitting: Family Medicine

## 2015-11-12 ENCOUNTER — Ambulatory Visit (INDEPENDENT_AMBULATORY_CARE_PROVIDER_SITE_OTHER): Payer: Managed Care, Other (non HMO) | Admitting: Family Medicine

## 2015-11-12 VITALS — BP 126/78 | HR 87 | Temp 97.7°F | Ht 72.0 in | Wt 231.0 lb

## 2015-11-12 DIAGNOSIS — E0590 Thyrotoxicosis, unspecified without thyrotoxic crisis or storm: Secondary | ICD-10-CM

## 2015-11-12 DIAGNOSIS — R059 Cough, unspecified: Secondary | ICD-10-CM

## 2015-11-12 DIAGNOSIS — F411 Generalized anxiety disorder: Secondary | ICD-10-CM | POA: Diagnosis not present

## 2015-11-12 DIAGNOSIS — R05 Cough: Secondary | ICD-10-CM | POA: Diagnosis not present

## 2015-11-12 DIAGNOSIS — R053 Chronic cough: Secondary | ICD-10-CM

## 2015-11-12 MED ORDER — CLONAZEPAM 0.5 MG PO TABS: 0.5000 mg | ORAL_TABLET | Freq: Two times a day (BID) | ORAL | Status: DC | PRN

## 2015-11-12 MED ORDER — ALBUTEROL SULFATE HFA 108 (90 BASE) MCG/ACT IN AERS: 2.0000 | INHALATION_SPRAY | Freq: Four times a day (QID) | RESPIRATORY_TRACT | Status: DC | PRN

## 2015-11-12 NOTE — Patient Instructions (Signed)
Nice to meet you.  We will obtain a chest x-ray and pulmonary function tests to evaluate your cough and wheezing. You can continue using albuterol as needed. I refilled  your Klonopin.  Please use this only as needed. We will obtain thyroid study tests and refer you to endocrinology. Once the thyroid test returned we will review short supply of your methimazole.  if you develop chest pain, shortness of breath, fever, productive cough, increased anxiety, depression , palpitations, or any new or changing symptoms please seek medical attention.

## 2015-11-12 NOTE — Progress Notes (Signed)
Pre visit review using our clinic review tool, if applicable. No additional management support is needed unless otherwise documented below in the visit note. 

## 2015-11-13 ENCOUNTER — Other Ambulatory Visit: Payer: Self-pay | Admitting: Surgical

## 2015-11-13 DIAGNOSIS — E0590 Thyrotoxicosis, unspecified without thyrotoxic crisis or storm: Secondary | ICD-10-CM

## 2015-11-13 LAB — T4: T4 TOTAL: 16.3 ug/dL — AB (ref 4.5–12.0)

## 2015-11-13 LAB — T3, FREE: T3 FREE: 9.7 pg/mL — AB (ref 2.3–4.2)

## 2015-11-13 LAB — TSH: TSH: 0.16 u[IU]/mL — ABNORMAL LOW (ref 0.35–4.50)

## 2015-11-13 MED ORDER — METHIMAZOLE 10 MG PO TABS: 30.0000 mg | ORAL_TABLET | Freq: Every day | ORAL | Status: DC

## 2015-11-13 MED ORDER — ATENOLOL 100 MG PO TABS: 100.0000 mg | ORAL_TABLET | Freq: Every day | ORAL | Status: DC

## 2015-11-14 ENCOUNTER — Encounter: Payer: Self-pay | Admitting: Family Medicine

## 2015-11-14 DIAGNOSIS — R059 Cough, unspecified: Secondary | ICD-10-CM | POA: Insufficient documentation

## 2015-11-14 DIAGNOSIS — R05 Cough: Secondary | ICD-10-CM | POA: Insufficient documentation

## 2015-11-14 NOTE — Assessment & Plan Note (Signed)
Patient with history hyperthyroidism. Relatively asymptomatic at this time. We will recheck his thyroid labs and refer to endocrinology.

## 2015-11-14 NOTE — Assessment & Plan Note (Signed)
Suspect this is likely related to his allergic rhinitis, though could be that reactive airway disease with possible wheezing could be contributing. His lung sounds are normal today. Doubt an acute process given chronicity of issue. Vitals are stable and lung sounds are normal making pneumonia and VTE unlikely causes. He is advised on nasal steroid and antihistamine for allergies. We will obtain PFTs to evaluate for reactive airway disease process. Given chronicity we will also obtain a chest x-ray. He is given return precautions.

## 2015-11-14 NOTE — Progress Notes (Signed)
Patient ID: Edward Hurley, male   DOB: 10/31/1976, 39 y.o.   MRN: [id number]  [name], MD Phone: [phone]  Edward Hurley is a 39 y.o. male who presents today for new patient visit.  Anxiety: Patient notes history of anxiety. He notes this typically is present at a low level, though does get worse when he needs to give a presentation or some other big event. Notes he has been on sertraline for a number of years. Notes this is helped significantly. It is helped decrease his frustrations. He does not feel depressed. Has never seen a therapist. He was on Klonopin as needed, though has not taken it in a couple months. Notes he took it a couple times a week when he needed to make a big presentation at work.  Hyperthyroidism: Patient diagnosed with this previously. He was on methimazole, though has not been on it in the last month. He has not been evaluated by an endocrinologist in several months. He notes minimal intermittent tremor currently with this. No issues with palpitations or weight loss. He does note he is intermittently sweaty and having moist skin with this. He notes when he is on the methimazole there is a huge difference in the symptoms.  Cough: Patient notes he has had intermittent cough and mild wheeze with a cough for the last year. He notes that if he has a significant coughing episode that he can feel mildly short of breath, though has no other shortness of breath. No exertional symptoms or chest pain. He notes he had bronchitis in February, though this is resolved. He has been using albuterol intermittently with some benefit. He does have a history of allergies. He does smoke 1-2 cigarettes a day. No history of asthma. He has taken Claritin and Nasacort in the past for his allergies. No history of reflux. No recent fevers. Notes the wheezing only occurs when he coughs or when he laughs.  Active Ambulatory Problems    Diagnosis Date Noted  . Hyperthyroidism  10/08/2014  . Essential hypertension, benign 10/08/2014  . Anxiety state 10/10/2014  . Allergic rhinitis 10/10/2014  . Cough 11/14/2015   Resolved Ambulatory Problems    Diagnosis Date Noted  . No Resolved Ambulatory Problems   Past Medical History  Diagnosis Date  . Hypertension   . Irritability   . Allergy   . Anxiety   . Heart murmur   . Headache     Family History  Problem Relation Age of Onset  . Thyroid disease Neg Hx   . Hyperlipidemia Father   . Alcoholism Maternal Uncle   . Prostate cancer Paternal Uncle   . Hypertension Father   . Mental illness Maternal Uncle     Social History   Social History  . Marital Status: Married    Spouse Name: N/A  . Number of Children: N/A  . Years of Education: N/A   Occupational History  . Not on file.   Social History Main Topics  . Smoking status: Current Some Day Smoker  . Smokeless tobacco: Not on file  . Alcohol Use: 0.0 oz/week    0 Standard drinks or equivalent per week  . Drug Use: No  . Sexual Activity: Not on file   Other Topics Concern  . Not on file   Social History Narrative    ROS   General:  Negative for nexplained weight loss, fever Skin: Negative for new or changing mole, sore that won't heal HEENT: Positive for trouble seeing,  Negative for trouble hearing, ringing in ears, mouth sores, hoarseness, change in voice, dysphagia. CV:  Negative for chest pain, exertional dyspnea edema, palpitations Resp: Positive for cough and trouble breathing, Negative for hemoptysis GI: Negative for nausea, vomiting, diarrhea, constipation, abdominal pain, melena, hematochezia. GU: Negative for dysuria, incontinence, urinary hesitance, hematuria, vaginal or penile discharge, polyuria, sexual difficulty, lumps in testicle or breasts MSK: Negative for muscle cramps or aches, joint pain or swelling Neuro: Negative for headaches, weakness, numbness, dizziness, passing out/fainting Psych: Positive for anxiety Negative  for depression, anxiety, memory problems  Objective  Physical Exam Filed Vitals:   11/12/15 1531  BP: 126/78  Pulse: 87  Temp: 97.7 F (36.5 C)    Physical Exam  Constitutional: He is well-developed, well-nourished, and in no distress.  HENT:  Head: Normocephalic and atraumatic.  Right Ear: External ear normal.  Left Ear: External ear normal.  Mouth/Throat: Oropharynx is clear and moist. No oropharyngeal exudate.  Eyes: Conjunctivae are normal. Pupils are equal, round, and reactive to light.  Neck: Neck supple. No thyromegaly present.  Cardiovascular: Normal rate, regular rhythm and normal heart sounds.  Exam reveals no gallop and no friction rub.   No murmur heard. Pulmonary/Chest: Effort normal and breath sounds normal. No respiratory distress. He has no wheezes. He has no rales. He exhibits no tenderness.  Abdominal: Soft. Bowel sounds are normal. He exhibits no distension. There is no tenderness. There is no rebound and no guarding.  Musculoskeletal: He exhibits no edema.  Lymphadenopathy:    He has no cervical adenopathy.  Neurological: He is alert. Gait normal.  Skin: Skin is warm and dry. He is not diaphoretic.  Psychiatric: Affect normal.  Mood mildly anxious     Assessment/Plan:   Anxiety state Patient with anxiety. Appears well controlled on sertraline. Has been on as needed Klonopin in the past. We will refill this. We will continue sertraline. He'll let us know if this worsens. If he requires Klonopin on a daily basis he will let us know as well. Given return precautions  Hyperthyroidism Patient with history hyperthyroidism. Relatively asymptomatic at this time. We will recheck his thyroid labs and refer to endocrinology.  Cough Suspect this is likely related to his allergic rhinitis, though could be that reactive airway disease with possible wheezing could be contributing. His lung sounds are normal today. Doubt an acute process given chronicity of issue.  Vitals are stable and lung sounds are normal making pneumonia and VTE unlikely causes. He is advised on nasal steroid and antihistamine for allergies. We will obtain PFTs to evaluate for reactive airway disease process. Given chronicity we will also obtain a chest x-ray. He is given return precautions.    Orders Placed This Encounter  Procedures  . DG Chest 2 View    Standing Status: Future     Number of Occurrences:      Standing Expiration Date: 01/12/2017    Order Specific Question:  Reason for Exam (SYMPTOM  OR DIAGNOSIS REQUIRED)    Answer:  chronic cough, wheezing    Order Specific Question:  Preferred imaging location?    Answer:  [facility]  . TSH  . T3, free  . T4  . Ambulatory referral to Endocrinology    Referral Priority:  Routine    Referral Type:  Consultation    Referral Reason:  Specialty Services Required    Number of Visits Requested:  1  . Pulmonary Function Test [facility] Only    Standing Status: Future  Number of Occurrences:      Standing Expiration Date: 11/11/2016    Order Specific Question:  Full PFT: includes the following: basic spirometry, spirometry pre & post bronchodilator, diffusion capacity (DLCO), lung volumes    Answer:  Full PFT    Meds ordered this encounter  Medications  . DISCONTD: clonazePAM (KLONOPIN) 0.5 MG tablet    Sig: Take 1 tablet (0.5 mg total) by mouth 2 (two) times daily as needed for anxiety.    Dispense:  30 tablet    Refill:  0    Not to exceed 4 additional fills before 10/14/2015  . albuterol (PROVENTIL HFA;VENTOLIN HFA) 108 (90 BASE) MCG/ACT inhaler    Sig: Inhale 2 puffs into the lungs every 6 (six) hours as needed for wheezing or shortness of breath.    Dispense:  1 Inhaler    Refill:  0  . clonazePAM (KLONOPIN) 0.5 MG tablet    Sig: Take 1 tablet (0.5 mg total) by mouth 2 (two) times daily as needed for anxiety.    Dispense:  30 tablet    Refill:  0    Dragon voice recognition software was used during the  dictation process of this note. If any phrases or words seem inappropriate it is likely secondary to the translation process being inefficient.  Marikay AlarEric Tateanna Bach

## 2015-11-14 NOTE — Assessment & Plan Note (Signed)
Patient with anxiety. Appears well controlled on sertraline. Has been on as needed Klonopin in the past. We will refill this. We will continue sertraline. He'll let us know if this worsens. If he requires Klonopin on a daily basis he will let us know as well. Given return precautions

## 2015-12-08 ENCOUNTER — Other Ambulatory Visit: Payer: Self-pay | Admitting: Family Medicine

## 2015-12-08 MED ORDER — SERTRALINE HCL 100 MG PO TABS: 100.0000 mg | ORAL_TABLET | Freq: Every day | ORAL | Status: DC

## 2015-12-12 ENCOUNTER — Other Ambulatory Visit: Payer: Self-pay | Admitting: Family Medicine

## 2015-12-12 DIAGNOSIS — E0590 Thyrotoxicosis, unspecified without thyrotoxic crisis or storm: Secondary | ICD-10-CM

## 2015-12-12 NOTE — Telephone Encounter (Signed)
Refills given. Patient was supposed to see endocrinology. I will replace the referral. Please try to get him into any endocrinologist that will take him. He needs to see endocrinology for refills on his methimazole in the future. Thank you.

## 2015-12-12 NOTE — Telephone Encounter (Signed)
Please advise on refills.  

## 2015-12-15 ENCOUNTER — Ambulatory Visit: Payer: Self-pay | Admitting: Family Medicine

## 2015-12-15 ENCOUNTER — Telehealth: Payer: Self-pay | Admitting: Family Medicine

## 2015-12-15 NOTE — Telephone Encounter (Signed)
FYI, Pt will not be able to come in today for his appt. Pt states his job did not have any coverage. Pt did reschedule the appt. Appt is still on the schedule. Thank you!

## 2015-12-18 ENCOUNTER — Ambulatory Visit (INDEPENDENT_AMBULATORY_CARE_PROVIDER_SITE_OTHER): Payer: Managed Care, Other (non HMO) | Admitting: Family Medicine

## 2015-12-18 ENCOUNTER — Encounter: Payer: Self-pay | Admitting: Family Medicine

## 2015-12-18 VITALS — BP 122/68 | HR 77 | Temp 98.0°F | Ht 72.0 in | Wt 232.8 lb

## 2015-12-18 DIAGNOSIS — R05 Cough: Secondary | ICD-10-CM

## 2015-12-18 DIAGNOSIS — R062 Wheezing: Secondary | ICD-10-CM | POA: Diagnosis not present

## 2015-12-18 DIAGNOSIS — F411 Generalized anxiety disorder: Secondary | ICD-10-CM | POA: Diagnosis not present

## 2015-12-18 DIAGNOSIS — R059 Cough, unspecified: Secondary | ICD-10-CM

## 2015-12-18 DIAGNOSIS — E0590 Thyrotoxicosis, unspecified without thyrotoxic crisis or storm: Secondary | ICD-10-CM | POA: Diagnosis not present

## 2015-12-18 LAB — CBC WITH DIFFERENTIAL/PLATELET
Basophils Absolute: 0 10*3/uL (ref 0.0–0.1)
Basophils Relative: 0.2 % (ref 0.0–3.0)
EOS PCT: 1.7 % (ref 0.0–5.0)
Eosinophils Absolute: 0.2 10*3/uL (ref 0.0–0.7)
HEMATOCRIT: 45.2 % (ref 39.0–52.0)
HEMOGLOBIN: 15.2 g/dL (ref 13.0–17.0)
LYMPHS PCT: 23.6 % (ref 12.0–46.0)
Lymphs Abs: 2.2 10*3/uL (ref 0.7–4.0)
MCHC: 33.6 g/dL (ref 30.0–36.0)
MCV: 93.8 fl (ref 78.0–100.0)
MONO ABS: 0.6 10*3/uL (ref 0.1–1.0)
MONOS PCT: 6.2 % (ref 3.0–12.0)
Neutro Abs: 6.3 10*3/uL (ref 1.4–7.7)
Neutrophils Relative %: 68.3 % (ref 43.0–77.0)
Platelets: 231 10*3/uL (ref 150.0–400.0)
RBC: 4.82 Mil/uL (ref 4.22–5.81)
RDW: 12.8 % (ref 11.5–15.5)
WBC: 9.3 10*3/uL (ref 4.0–10.5)

## 2015-12-18 LAB — T4: T4 TOTAL: 11.8 ug/dL (ref 4.5–12.0)

## 2015-12-18 LAB — TSH: TSH: 0.16 u[IU]/mL — AB (ref 0.35–4.50)

## 2015-12-18 LAB — T3, FREE: T3, Free: 6.2 pg/mL — ABNORMAL HIGH (ref 2.3–4.2)

## 2015-12-18 NOTE — Patient Instructions (Signed)
Nice to see you.  We will re-refer you to endocrinology. Please continue your anxiety medication and if this worsens please let us know.  We will get you set up for PFTs to test your breathing.  If you develop worsening cough, or develop shortness of breath, chest pain, fevers, productive cough, or new or changing symptoms please seek medical attention.

## 2015-12-18 NOTE — Assessment & Plan Note (Signed)
Symptoms of improved. He has not gotten in to see endocrinology. I re-replaced referral and spoken with the referral coordinator to get him into an office that he has not been to previously so that he can be seen by an endocrinologist. Edward Hurley recheck TSH, free T3, T4, and a CBC today.

## 2015-12-18 NOTE — Progress Notes (Signed)
Patient ID: Edward Hurley, male   DOB: 23-Nov-1975, 40 y.o.   MRN: [id number]  [name], MD Phone: [phone]  Edward Hurley is a 40 y.o. male who presents today for follow-up.  Hyperthyroidism: Patient has been taking methimazole. Notes improvement in his symptoms. No palpitations. No weight loss. No tremor. Sweating is much improved. He has not seen endocrinology as his referrals got canceled due to noncompliance at the offices that he is referred to.  Anxiety: Much improved. Notes the atenolol helps calm him down for a big presentations. Sertraline has been helpful. 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 His only anxiety symptom typically is becoming easily irritable. Denies feelings of nervousness and anxiousness. Denies feelings of worrying.  Cough: Patient notes this is improved significantly. Occasionally gets cough. When he gets the cough he occasionally has issues catching his breath. Denies exertional shortness of breath and chest pain. When he gets the cough he uses the inhaler and this is helpful. He notes a family history of asthma. Has been using Nasacort and this has resulted in less rhinorrhea and drainage. He has not gotten the PFTs yet.  PMH: Smoker.   ROS see history of present illness  Objective  Physical Exam Filed Vitals:   12/18/15 1316  BP: 122/68  Pulse: 77  Temp: 98 F (36.7 C)    BP Readings from Last 3 Encounters:  12/18/15 122/68  11/12/15 126/78  03/06/15 128/64   Wt Readings from Last 3 Encounters:  12/18/15 232 lb 12.8 oz (105.597 kg)  11/12/15 231 lb (104.781 kg)  03/06/15 217 lb 8 oz (98.657 kg)    Physical Exam  Constitutional: He is well-developed, well-nourished, and in no distress.  HENT:  Head: Normocephalic and atraumatic.  Right Ear: External ear normal.  Left Ear: External ear normal.    Mouth/Throat: Oropharynx is clear and moist. No oropharyngeal exudate.  Eyes: Conjunctivae are normal. Pupils are equal, round, and reactive to light.  Neck: Neck supple. No thyromegaly present.  Cardiovascular: Normal rate, regular rhythm and normal heart sounds.  Exam reveals no gallop and no friction rub.   No murmur heard. Pulmonary/Chest: Effort normal and breath sounds normal. No respiratory distress. He has no wheezes. He has no rales.  Lymphadenopathy:    He has no cervical adenopathy.  Neurological: He is alert. Gait normal.  Skin: He is not diaphoretic.  Psychiatric: Mood and affect normal.     Assessment/Plan: Please see individual problem list.  Hyperthyroidism Symptoms of improved. He has not gotten in to see endocrinology. I re-replaced referral and spoken with the referral coordinator to get him into an office that he has not been to previously so that he can be seen by an endocrinologist. [name] recheck TSH, free T3, T4, and a CBC today.  Cough Improved. Suspect some of his cough symptoms are likely related to allergic rhinitis, though with improvement with albuterol could possibly have some measure of reactive airway disease. He has a benign lung exam today. He had been unable to get the PFTs as these were not scheduled for him. He did not get the chest x-ray. Discussed obtaining chest x-ray, though he declines at this time and I think this is reasonable to wait until after the PFTs are completed. I reordered the PFTs and inform the referral coordinator that they've then placed so that  they can be coordinated. He will continue Nasacort. Can add Claritin. Continue as needed albuterol inhaler. Given return precautions.  Anxiety state Much improved. Continue sertraline and Klonopin.    Orders Placed This Encounter  Procedures  . TSH  . T3, free  . T4  . CBC w/Diff  . Ambulatory referral to Endocrinology    Referral Priority:  Routine    Referral Type:  Consultation     Referral Reason:  Specialty Services Required    Number of Visits Requested:  1  . Pulmonary function test    Standing Status: Future     Number of Occurrences:      Standing Expiration Date: 12/17/2016    Order Specific Question:  Where should this test be performed?    Answer:  [facility]    Order Specific Question:  Full PFT: includes the following: basic spirometry, spirometry pre & post bronchodilator, diffusion capacity (DLCO), lung volumes    Answer:  Full PFT    [name]

## 2015-12-18 NOTE — Progress Notes (Signed)
Pre visit review using our clinic review tool, if applicable. No additional management support is needed unless otherwise documented below in the visit note. 

## 2015-12-18 NOTE — Assessment & Plan Note (Signed)
Improved. Suspect some of his cough symptoms are likely related to allergic rhinitis, though with improvement with albuterol could possibly have some measure of reactive airway disease. He has a benign lung exam today. He had been unable to get the PFTs as these were not scheduled for him. He did not get the chest x-ray. Discussed obtaining chest x-ray, though he declines at this time and I think this is reasonable to wait until after the PFTs are completed. I reordered the PFTs and inform the referral coordinator that they've then placed so that they can be coordinated. He will continue Nasacort. Can add Claritin. Continue as needed albuterol inhaler. Given return precautions.

## 2015-12-18 NOTE — Assessment & Plan Note (Signed)
Much improved. Continue sertraline and Klonopin.

## 2015-12-29 ENCOUNTER — Other Ambulatory Visit: Payer: Self-pay | Admitting: Family Medicine

## 2015-12-29 MED ORDER — METHIMAZOLE 10 MG PO TABS: ORAL_TABLET | ORAL | Status: DC

## 2015-12-31 ENCOUNTER — Other Ambulatory Visit: Payer: Self-pay

## 2015-12-31 NOTE — Telephone Encounter (Signed)
Got a fax from [facility] ([email or web address]) requesting a 90 day refill of this patient's Zoloft .  Refill request was sent to Dr. Alba Cory for approval and submission.  Patient was last seen on 03/06/15 and this medication was last filled 01/03/15.  I believe this patient is now a patient of Dr. Doree Fudge with [facility].

## 2016-01-01 ENCOUNTER — Other Ambulatory Visit: Payer: Self-pay

## 2016-01-05 ENCOUNTER — Other Ambulatory Visit: Payer: Self-pay

## 2016-01-05 MED ORDER — ATENOLOL 100 MG PO TABS: ORAL_TABLET | ORAL | Status: DC

## 2016-01-27 ENCOUNTER — Other Ambulatory Visit: Payer: Self-pay | Admitting: Family Medicine

## 2016-02-08 ENCOUNTER — Encounter: Payer: Self-pay | Admitting: Family Medicine

## 2016-02-09 ENCOUNTER — Other Ambulatory Visit: Payer: Self-pay | Admitting: Surgical

## 2016-02-09 ENCOUNTER — Other Ambulatory Visit: Payer: Self-pay | Admitting: Family Medicine

## 2016-02-09 MED ORDER — SERTRALINE HCL 100 MG PO TABS: 100.0000 mg | ORAL_TABLET | Freq: Every day | ORAL | Status: DC

## 2016-02-12 ENCOUNTER — Ambulatory Visit (INDEPENDENT_AMBULATORY_CARE_PROVIDER_SITE_OTHER): Payer: Managed Care, Other (non HMO) | Admitting: *Deleted

## 2016-02-12 DIAGNOSIS — R062 Wheezing: Secondary | ICD-10-CM

## 2016-02-12 LAB — PULMONARY FUNCTION TEST
DL/VA % PRED: 120 %
DL/VA: 5.78 ml/min/mmHg/L
DLCO unc % pred: 91 %
DLCO unc: 32.03 ml/min/mmHg
FEF 25-75 POST: 4.5 L/s
FEF 25-75 Pre: 3.78 L/sec
FEF2575-%CHANGE-POST: 19 %
FEF2575-%PRED-POST: 105 %
FEF2575-%PRED-PRE: 88 %
FEV1-%Change-Post: 2 %
FEV1-%Pred-Post: 74 %
FEV1-%Pred-Pre: 73 %
FEV1-PRE: 3.3 L
FEV1-Post: 3.38 L
FEV1FVC-%CHANGE-POST: 3 %
FEV1FVC-%Pred-Pre: 106 %
FEV6-%CHANGE-POST: -1 %
FEV6-%Pred-Post: 68 %
FEV6-%Pred-Pre: 69 %
FEV6-Post: 3.81 L
FEV6-Pre: 3.86 L
FEV6FVC-%Pred-Post: 102 %
FEV6FVC-%Pred-Pre: 102 %
FVC-%CHANGE-POST: -1 %
FVC-%PRED-POST: 67 %
FVC-%PRED-PRE: 68 %
FVC-POST: 3.81 L
FVC-PRE: 3.86 L
POST FEV1/FVC RATIO: 89 %
PRE FEV1/FVC RATIO: 85 %
Post FEV6/FVC ratio: 100 %
Pre FEV6/FVC Ratio: 100 %
RV % pred: 54 %
RV: 1.03 L
TLC % PRED: 67 %
TLC: 4.96 L

## 2016-02-12 NOTE — Progress Notes (Signed)
Performed PFT today.

## 2016-02-13 ENCOUNTER — Telehealth: Payer: Self-pay | Admitting: *Deleted

## 2016-02-13 NOTE — Telephone Encounter (Signed)
PFT results VM interpretation faxed Dr. Birdie SonsSonnenberg at [phone].

## 2016-02-25 NOTE — Telephone Encounter (Signed)
Patient called wanting results from his PFT test.

## 2016-02-25 NOTE — Telephone Encounter (Signed)
LM on patients VM that we will call with test results as soon as Dr. Birdie SonsSonnenberg has a chance to look at these. If he has any questions will call back

## 2016-03-01 NOTE — Telephone Encounter (Signed)
Can you please look at PFT results.

## 2016-03-02 ENCOUNTER — Telehealth: Payer: Self-pay | Admitting: Family Medicine

## 2016-03-02 ENCOUNTER — Other Ambulatory Visit: Payer: Self-pay | Admitting: Family Medicine

## 2016-03-02 NOTE — Telephone Encounter (Signed)
LM for patient to return call.

## 2016-03-02 NOTE — Telephone Encounter (Signed)
I apologize for the delay. Please let the patient know this. His PFTs did not reveal an obstructive process. No asthma or COPD. The PFTs did reveal a restrictive process that they felt was likely related to chest wall restriction possibly relating to his weight. I would recommend weight loss to help with this. Thanks.

## 2016-03-02 NOTE — Telephone Encounter (Signed)
I am assuming he is wanting his PFT's I see preliminary results, please advise. thanks

## 2016-03-02 NOTE — Telephone Encounter (Signed)
Patient stated that he can be reach after 4:30, he's a Runner, broadcasting/film/videoteacher. Contact [phone] A detail message can be left on voicemail

## 2016-03-02 NOTE — Telephone Encounter (Signed)
Spoke with the patient and reviewed his results.  Patient verbalized understanding.

## 2016-03-02 NOTE — Telephone Encounter (Signed)
Pt called for his breathing test results. Please call pt.

## 2016-03-02 NOTE — Telephone Encounter (Signed)
Please see subsequent phone note

## 2016-03-02 NOTE — Telephone Encounter (Signed)
Please advise on refill.

## 2016-03-02 NOTE — Telephone Encounter (Signed)
Please determine if the patient has an appointment with endocrinology. I will not refill this if he does not have an appointment.

## 2016-03-02 NOTE — Telephone Encounter (Signed)
Left a VM to return my call. thanks 

## 2016-03-03 NOTE — Telephone Encounter (Signed)
LM for patient to return call.

## 2016-03-04 ENCOUNTER — Encounter: Payer: Self-pay | Admitting: Family Medicine

## 2016-03-04 ENCOUNTER — Ambulatory Visit (INDEPENDENT_AMBULATORY_CARE_PROVIDER_SITE_OTHER): Payer: Managed Care, Other (non HMO) | Admitting: Family Medicine

## 2016-03-04 VITALS — BP 124/86 | HR 81 | Temp 98.0°F | Ht 72.0 in | Wt 239.0 lb

## 2016-03-04 DIAGNOSIS — R1013 Epigastric pain: Secondary | ICD-10-CM | POA: Diagnosis not present

## 2016-03-04 DIAGNOSIS — R0689 Other abnormalities of breathing: Secondary | ICD-10-CM

## 2016-03-04 DIAGNOSIS — R5382 Chronic fatigue, unspecified: Secondary | ICD-10-CM | POA: Diagnosis not present

## 2016-03-04 DIAGNOSIS — R0602 Shortness of breath: Secondary | ICD-10-CM | POA: Diagnosis not present

## 2016-03-04 LAB — T4, FREE: FREE T4: 0.7 ng/dL (ref 0.60–1.60)

## 2016-03-04 LAB — CBC
HCT: 49.4 % (ref 39.0–52.0)
HEMOGLOBIN: 16.9 g/dL (ref 13.0–17.0)
MCHC: 34.1 g/dL (ref 30.0–36.0)
MCV: 94 fl (ref 78.0–100.0)
PLATELETS: 201 10*3/uL (ref 150.0–400.0)
RBC: 5.26 Mil/uL (ref 4.22–5.81)
RDW: 14 % (ref 11.5–15.5)
WBC: 10.1 10*3/uL (ref 4.0–10.5)

## 2016-03-04 LAB — COMPREHENSIVE METABOLIC PANEL
ALBUMIN: 4.7 g/dL (ref 3.5–5.2)
ALT: 34 U/L (ref 0–53)
AST: 18 U/L (ref 0–37)
Alkaline Phosphatase: 76 U/L (ref 39–117)
BUN: 14 mg/dL (ref 6–23)
CALCIUM: 9.6 mg/dL (ref 8.4–10.5)
CHLORIDE: 108 meq/L (ref 96–112)
CO2: 26 mEq/L (ref 19–32)
Creatinine, Ser: 1 mg/dL (ref 0.40–1.50)
GFR: 88.74 mL/min (ref >60.00)
Glucose, Bld: 106 mg/dL — ABNORMAL HIGH (ref 70–99)
POTASSIUM: 4.3 meq/L (ref 3.5–5.1)
SODIUM: 141 meq/L (ref 135–145)
Total Bilirubin: 0.5 mg/dL (ref 0.2–1.2)
Total Protein: 7.3 g/dL (ref 6.0–8.3)

## 2016-03-04 LAB — TSH: TSH: 0.12 u[IU]/mL — ABNORMAL LOW (ref 0.35–4.50)

## 2016-03-04 LAB — LIPASE: Lipase: 7 U/L — ABNORMAL LOW (ref 11.0–59.0)

## 2016-03-04 LAB — T3, FREE: T3, Free: 3.2 pg/mL (ref 2.3–4.2)

## 2016-03-04 MED ORDER — OMEPRAZOLE 40 MG PO CPDR: 40.0000 mg | DELAYED_RELEASE_CAPSULE | Freq: Every day | ORAL | Status: DC

## 2016-03-04 NOTE — Patient Instructions (Signed)
Nice to see you. We will place you on Prilosec for your abdominal discomfort. We'll also check some lab work today. If you develop worsening abdominal pain, nausea, vomiting, fevers, blood in her stool, shortness of breath, chest pain, sweating, or any new or changing symptoms please seek medical attention. Please call the endocrinology office to reschedule your appointment.

## 2016-03-04 NOTE — Progress Notes (Signed)
Pre visit review using our clinic review tool, if applicable. No additional management support is needed unless otherwise documented below in the visit note. 

## 2016-03-04 NOTE — Progress Notes (Signed)
Patient ID: Edward Hurley, male   DOB: 1976/10/18, 40 y.o.   MRN: [id number]  [name], MD Phone: [phone]  Edward Hurley is a 40 y.o. male who presents today for same-day visit.  Patient presents for a burning discomfort in his epigastric region. Notes this has been going on for several weeks. Has gotten worse over the last several days. Notes some reflux with this and burping. Vomited one time several weeks ago. No diarrhea. Is passing some gas and not excessively. No constipation. No fevers. Not taking many NSAIDs. No dysuria. Notes it feels as though his stomach is hungry all the time. He still has his appendix and gallbladder. Has tried Pepcid and Zantac with no benefit. No blood in the stool.  Patient also notes over the last week or so he's had some breathing issues that he describes as catching your breath after crying when you were a child. Notes some exertional shortness of breath that has been more chronic. No chest pain or diaphoresis with this. No wheezing. No shortness of breath at this time. He had PFTs that revealed a restrictive lung pattern. No obstructive lung pattern seen.  PMH: Smoker   ROS see history of present illness  Objective  Physical Exam Filed Vitals:   03/04/16 1130  BP: 124/86  Pulse: 81  Temp: 98 F (36.7 C)    BP Readings from Last 3 Encounters:  03/04/16 124/86  12/18/15 122/68  11/12/15 126/78   Wt Readings from Last 3 Encounters:  03/04/16 239 lb (108.41 kg)  12/18/15 232 lb 12.8 oz (105.597 kg)  11/12/15 231 lb (104.781 kg)    Physical Exam  Constitutional: He is well-developed, well-nourished, and in no distress.  HENT:  Head: Normocephalic and atraumatic.  Right Ear: External ear normal.  Left Ear: External ear normal.  Cardiovascular: Normal rate, regular rhythm and normal heart sounds.  Exam reveals no gallop and no friction rub.   No murmur heard. Pulmonary/Chest: Effort normal and breath sounds normal.  No respiratory distress. He has no wheezes. He has no rales.  Abdominal: Soft. Bowel sounds are normal. He exhibits no distension. There is no tenderness. There is no rebound and no guarding.  Musculoskeletal: He exhibits no edema.  Neurological: He is alert. Gait normal.  Skin: Skin is warm and dry. He is not diaphoretic.   EKG: Normal sinus rhythm, rate 69, RSR prime, no ST or T-wave changes  Assessment/Plan: Please see individual problem list.  Epigastric discomfort Patient's epigastric discomfort likely due to gastritis, though given location could be related to his pancreatitis or liver. He has a benign abdominal exam today. We will start him on Prilosec to treat likely gastritis. We will check labs as outlined below. He will continue to monitor. He is given return precautions.  Breathing difficulty Patient reports an issue breathing that he describes as like catching your breath after crying when you were a child. Also notes some mild exertional shortness of breath for some time. We sent him for PFTs previously that revealed a restrictive component that could be contributing. I advised him of this. EKG was done today that did not show any ischemic changes. We will obtain a CBC and thyroid studies to ensure that these are not contributing. I offered referral to cardiology or pulmonology to evaluate this further and also offered chest x-ray to evaluate this further though the patient declined these opting to monitor. He will continue to monitor. He is given return precautions.  Orders Placed This Encounter  Procedures  . Comp Met (CMET)  . CBC  . Lipase  . TSH  . T3, free  . T4, free  . EKG 12-Lead    Meds ordered this encounter  Medications  . omeprazole (PRILOSEC) 40 MG capsule    Sig: Take 1 capsule (40 mg total) by mouth daily.    Dispense:  30 capsule    Refill:  [place], MD [place]

## 2016-03-07 DIAGNOSIS — R0689 Other abnormalities of breathing: Secondary | ICD-10-CM | POA: Insufficient documentation

## 2016-03-07 DIAGNOSIS — R1013 Epigastric pain: Secondary | ICD-10-CM | POA: Insufficient documentation

## 2016-03-07 NOTE — Assessment & Plan Note (Signed)
Patient's epigastric discomfort likely due to gastritis, though given location could be related to his pancreatitis or liver. He has a benign abdominal exam today. We will start him on Prilosec to treat likely gastritis. We will check labs as outlined below. He will continue to monitor. He is given return precautions.

## 2016-03-07 NOTE — Assessment & Plan Note (Signed)
Patient reports an issue breathing that he describes as like catching your breath after crying when you were a child. Also notes some mild exertional shortness of breath for some time. We sent him for PFTs previously that revealed a restrictive component that could be contributing. I advised him of this. EKG was done today that did not show any ischemic changes. We will obtain a CBC and thyroid studies to ensure that these are not contributing. I offered referral to cardiology or pulmonology to evaluate this further and also offered chest x-ray to evaluate this further though the patient declined these opting to monitor. He will continue to monitor. He is given return precautions.

## 2016-03-08 ENCOUNTER — Telehealth: Payer: Self-pay | Admitting: Family Medicine

## 2016-03-08 NOTE — Telephone Encounter (Signed)
Attempted to call patient back, left a VM to return my call.

## 2016-03-08 NOTE — Telephone Encounter (Signed)
Pt called for his lab results

## 2016-03-17 ENCOUNTER — Ambulatory Visit: Payer: Self-pay | Admitting: Family Medicine

## 2016-04-05 ENCOUNTER — Ambulatory Visit: Payer: Self-pay | Admitting: Family Medicine

## 2016-04-05 DIAGNOSIS — Z0289 Encounter for other administrative examinations: Secondary | ICD-10-CM

## 2016-04-21 ENCOUNTER — Telehealth: Payer: Self-pay | Admitting: Family Medicine

## 2016-04-21 ENCOUNTER — Ambulatory Visit: Payer: Self-pay | Admitting: Family Medicine

## 2016-04-21 DIAGNOSIS — Z0289 Encounter for other administrative examinations: Secondary | ICD-10-CM

## 2016-04-21 NOTE — Telephone Encounter (Signed)
Agree. Patient called 21 minutes prior to his appointment. This is not adequate time to inform us that he will not be able to make his appointment. Per our policy we will charge a no-show file.

## 2016-04-21 NOTE — Telephone Encounter (Signed)
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 Appt was resch. Appt is still on the sch. Let me know if I need to cancel appt. Thank you!  Call pt @ [phone].

## 2016-04-21 NOTE — Telephone Encounter (Signed)
Patient was charged $50 no show fee due to calling 10 minutes before appointment to cancel per Dr. Birdie SonsSonnenberg.

## 2016-04-22 ENCOUNTER — Other Ambulatory Visit: Payer: Self-pay | Admitting: *Deleted

## 2016-04-22 ENCOUNTER — Other Ambulatory Visit: Payer: Self-pay | Admitting: Family Medicine

## 2016-04-22 MED ORDER — OMEPRAZOLE 40 MG PO CPDR: 40.0000 mg | DELAYED_RELEASE_CAPSULE | Freq: Every day | ORAL | Status: DC

## 2016-04-22 NOTE — Telephone Encounter (Signed)
Please advise on refill. Patient missed appointment

## 2016-05-15 ENCOUNTER — Other Ambulatory Visit: Payer: Self-pay | Admitting: Family Medicine

## 2016-05-17 ENCOUNTER — Other Ambulatory Visit: Payer: Self-pay | Admitting: Surgical

## 2016-05-17 MED ORDER — SERTRALINE HCL 100 MG PO TABS: ORAL_TABLET | ORAL | Status: DC

## 2016-05-24 ENCOUNTER — Encounter: Payer: Self-pay | Admitting: Family Medicine

## 2016-05-24 ENCOUNTER — Ambulatory Visit (INDEPENDENT_AMBULATORY_CARE_PROVIDER_SITE_OTHER): Payer: Managed Care, Other (non HMO) | Admitting: Family Medicine

## 2016-05-24 VITALS — BP 116/74 | HR 79 | Temp 98.4°F | Ht 72.0 in | Wt 240.0 lb

## 2016-05-24 DIAGNOSIS — I10 Essential (primary) hypertension: Secondary | ICD-10-CM | POA: Diagnosis not present

## 2016-05-24 DIAGNOSIS — E0590 Thyrotoxicosis, unspecified without thyrotoxic crisis or storm: Secondary | ICD-10-CM

## 2016-05-24 DIAGNOSIS — R1013 Epigastric pain: Secondary | ICD-10-CM | POA: Diagnosis not present

## 2016-05-24 DIAGNOSIS — F411 Generalized anxiety disorder: Secondary | ICD-10-CM | POA: Diagnosis not present

## 2016-05-24 NOTE — Patient Instructions (Signed)
Nice to see you. Please stop your Prilosec. We will have you complete stool cards for your bleeding. Please continue your Zoloft and atenolol. If you develop chest pain, shortness of breath, palpitations, further bleeding, or any new or changing symptoms please seek medical attention.

## 2016-05-24 NOTE — Progress Notes (Signed)
Pre visit review using our clinic review tool, if applicable. No additional management support is needed unless otherwise documented below in the visit note. 

## 2016-05-25 NOTE — Assessment & Plan Note (Addendum)
Well-controlled at this time. Continue Zoloft. 

## 2016-05-25 NOTE — Progress Notes (Signed)
Patient ID: Edward PiedraMichael Helena Hurley, male   DOB: [birth year], 40 y.o.   MRN: [id number]  Edward AlarEric Darra Rosa, MD Phone: [phone]  Edward Hurley Edward Hurley is a 40 y.o. male who presents today for follow-up.  HYPERTENSION Disease Monitoring Home BP Monitoring not checking Chest pain- no    Dyspnea- no Medications Compliance-  taking atenolol.  Notes blood pressure has been much better controlled since being treated for hyperthyroidism though does note he is off his methimazole at this time and is following up with his endocrinologist for this.  Epigastric discomfort: Notes this is resolved at this time. Has been taking Prilosec. Notes more frequent bowel movements over the last several weeks with this. They are not diarrhea. No nausea or vomiting. No abdominal pain with this. Notes Imodium is beneficial. No hemorrhoids. 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 Notes some mild blood when wiping. No family history of colon cancer.  Anxiety: Patient notes Zoloft is been quite helpful. Not taking Klonopin at this time. No depression. Notes people in general make him more anxious. 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   PMH: Smoker   ROS see history of present illness  Objective  Physical Exam Filed Vitals:   05/24/16 1323  BP: 116/74  Pulse: 79  Temp: 98.4 F (36.9 C)    BP Readings from Last 3 Encounters:  05/24/16 116/74  03/04/16 124/86  12/18/15 122/68   Wt Readings from Last 3 Encounters:  05/24/16 240 lb (108.863 kg)  03/04/16 239 lb (108.41 kg)  12/18/15 232 lb 12.8 oz (105.597 kg)    Physical Exam  Constitutional: He is well-developed, well-nourished, and in no distress.  HENT:  Head: Normocephalic and atraumatic.  Cardiovascular: Normal rate, regular rhythm and normal heart sounds.   Pulmonary/Chest: Effort normal and breath sounds normal.  Abdominal: Soft. Bowel sounds are normal. He exhibits no distension. There is no tenderness. There  is no rebound and no guarding.  Genitourinary:  Refused rectal exam  Neurological: He is alert. Gait normal.  Psychiatric: Mood and affect normal.     Assessment/Plan: Please see individual problem list.  Epigastric discomfort Resolved with Prilosec. Currently still taking Prilosec. Potentially this could be contributing to his increased bowel movements. He will stop the Prilosec and monitor his bowel movements. He will also see if his symptoms return. If symptoms return he will need referral to GI. He was also given stool cards to complete for the blood in his stool. If positive referral to GI. If negative would consider rectal exam in the future to evaluate for hemorrhoids or fissure. Given return precautions.  Anxiety state Well controlled at this time. Continue Zoloft.  Essential hypertension, benign At goal. Continue atenolol.  Hyperthyroidism Currently off of methimazole. He will need follow-up with his endocrinologist.    Edward AlarEric Bertran Zeimet, MD [facility] [place]

## 2016-05-25 NOTE — Assessment & Plan Note (Signed)
Resolved with Prilosec. Currently still taking Prilosec. Potentially this could be contributing to his increased bowel movements. He will stop the Prilosec and monitor his bowel movements. He will also see if his symptoms return. If symptoms return he will need referral to GI. He was also given stool cards to complete for the blood in his stool. If positive referral to GI. If negative would consider rectal exam in the future to evaluate for hemorrhoids or fissure. Given return precautions.

## 2016-05-25 NOTE — Assessment & Plan Note (Signed)
At goal. Continue atenolol.

## 2016-05-25 NOTE — Assessment & Plan Note (Signed)
Currently off of methimazole. He will need follow-up with his endocrinologist.

## 2016-08-21 ENCOUNTER — Other Ambulatory Visit: Payer: Self-pay | Admitting: Family Medicine

## 2016-08-24 ENCOUNTER — Ambulatory Visit: Payer: Self-pay | Admitting: Family Medicine

## 2016-08-25 ENCOUNTER — Other Ambulatory Visit: Payer: Self-pay

## 2016-08-25 NOTE — Telephone Encounter (Signed)
Please determine why he needs this refilled given that it was stopped at his last office visit. Thanks.

## 2016-08-26 NOTE — Telephone Encounter (Signed)
Called patient.  No answer.

## 2016-10-04 ENCOUNTER — Ambulatory Visit: Payer: Self-pay | Admitting: Family Medicine

## 2016-10-04 DIAGNOSIS — Z0289 Encounter for other administrative examinations: Secondary | ICD-10-CM

## 2016-11-19 ENCOUNTER — Other Ambulatory Visit: Payer: Self-pay | Admitting: Family Medicine

## 2016-12-11 ENCOUNTER — Other Ambulatory Visit: Payer: Self-pay | Admitting: Family Medicine

## 2017-01-10 ENCOUNTER — Other Ambulatory Visit: Payer: Self-pay | Admitting: Family Medicine

## 2017-01-10 ENCOUNTER — Encounter: Payer: Self-pay | Admitting: Family Medicine

## 2017-01-10 NOTE — Telephone Encounter (Signed)
Sent message to patient to inform him to schedule a follow up before refills

## 2017-01-10 NOTE — Telephone Encounter (Signed)
Patient will need an office visit to discuss this as he has not been seen in the past 2 months and there are other safer treatments for performance anxiety.

## 2017-01-10 NOTE — Telephone Encounter (Signed)
Last OV 05/24/2016 last filled 11/12/15 30 0rf

## 2017-03-13 ENCOUNTER — Other Ambulatory Visit: Payer: Self-pay | Admitting: Family Medicine

## 2017-03-14 NOTE — Telephone Encounter (Signed)
Last OV 0703/17 last filled 12/13/16 90 0rf

## 2017-03-14 NOTE — Telephone Encounter (Signed)
Refill sent to pharmacy. Please contact patient and let him know that he needs a scheduled visit for further refills.

## 2017-03-15 NOTE — Telephone Encounter (Signed)
Left message to notify he will need a follow up appointment for further refills

## 2017-05-06 ENCOUNTER — Encounter: Payer: Self-pay | Admitting: Family Medicine

## 2017-05-06 ENCOUNTER — Ambulatory Visit (INDEPENDENT_AMBULATORY_CARE_PROVIDER_SITE_OTHER): Payer: Managed Care, Other (non HMO) | Admitting: Family Medicine

## 2017-05-06 VITALS — BP 170/110 | HR 101 | Temp 98.1°F | Ht 72.0 in | Wt 238.0 lb

## 2017-05-06 DIAGNOSIS — E0500 Thyrotoxicosis with diffuse goiter without thyrotoxic crisis or storm: Secondary | ICD-10-CM | POA: Diagnosis not present

## 2017-05-06 DIAGNOSIS — I10 Essential (primary) hypertension: Secondary | ICD-10-CM

## 2017-05-06 DIAGNOSIS — F419 Anxiety disorder, unspecified: Secondary | ICD-10-CM

## 2017-05-06 DIAGNOSIS — E0590 Thyrotoxicosis, unspecified without thyrotoxic crisis or storm: Secondary | ICD-10-CM

## 2017-05-06 LAB — COMPREHENSIVE METABOLIC PANEL
ALT: 38 U/L (ref 9–46)
AST: 20 U/L (ref 10–40)
Albumin: 4.2 g/dL (ref 3.6–5.1)
Alkaline Phosphatase: 93 U/L (ref 40–115)
BILIRUBIN TOTAL: 0.4 mg/dL (ref 0.2–1.2)
BUN: 12 mg/dL (ref 7–25)
CHLORIDE: 106 mmol/L (ref 98–110)
CO2: 23 mmol/L (ref 20–31)
CREATININE: 0.89 mg/dL (ref 0.60–1.35)
Calcium: 9.2 mg/dL (ref 8.6–10.3)
GLUCOSE: 201 mg/dL — AB (ref 65–99)
Potassium: 4.5 mmol/L (ref 3.5–5.3)
SODIUM: 140 mmol/L (ref 135–146)
Total Protein: 6.7 g/dL (ref 6.1–8.1)

## 2017-05-06 LAB — TSH: TSH: <0.01 mIU/L — ABNORMAL LOW (ref 0.40–4.50)

## 2017-05-06 LAB — CBC
HEMATOCRIT: 45.6 % (ref 38.5–50.0)
Hemoglobin: 15.7 g/dL (ref 13.2–17.1)
MCH: 31.2 pg (ref 27.0–33.0)
MCHC: 34.4 g/dL (ref 32.0–36.0)
MCV: 90.7 fL (ref 80.0–100.0)
MPV: 11.2 fL (ref 7.5–12.5)
PLATELETS: 252 10*3/uL (ref 140–400)
RBC: 5.03 MIL/uL (ref 4.20–5.80)
RDW: 13.8 % (ref 11.0–15.0)
WBC: 9.2 10*3/uL (ref 3.8–10.8)

## 2017-05-06 LAB — T3, FREE: T3 FREE: 8 pg/mL — AB (ref 2.3–4.2)

## 2017-05-06 LAB — T4, FREE: Free T4: 2.6 ng/dL — ABNORMAL HIGH (ref 0.8–1.8)

## 2017-05-06 MED ORDER — CLONAZEPAM 0.5 MG PO TABS: 0.5000 mg | ORAL_TABLET | Freq: Two times a day (BID) | ORAL | 0 refills | Status: DC | PRN

## 2017-05-06 NOTE — Progress Notes (Addendum)
Subjective:    Patient ID: Edward PiedraMichael Rosston Sun, male    DOB: March 03, 1976, 41 y.o.   MRN: [id number]  HPI This is a 41 yo male who presents today with anxiety.    Has not seen PCP in almost year.   Anxiety- worse with recent stress at work. Has a lot of stress at work. [facility] [employer] [place], year round. Behavior problems with one of students and disagreement regarding how to handle situation.Has had some recent "meltdowns." More emotional at work. 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 Wife also works there which can make things more complicated. Wants help dealing with his emotions. Does not feel that he has time to go to counseling, but will consider. Has used clonazepam in past with good relief of symptoms and thinks this would be helpful at this time. Does not get regular exercise. He has started acting in plays locally and while he enjoys it, finds it stressful at times.   Hyperthyroidism- Has history of hyperthyroidism, previously on methimazole. No follow up with endocrine. Stopped taking meds, "they wanted to ablate my thyroid right away." Does not seem knowledgeable of condition, potential complications or treatments. Last endocrine visit 5/17- note reviewed, patient with repeated starts/stops methimazole and poor compliance with follow up.   HTN- maintained on atenolol for many years.   Past Medical History:  Diagnosis Date  . Allergy   . Anxiety   . Headache   . Heart murmur   . Hypertension   . Hyperthyroidism   . Irritability    No past surgical history on file. Family History  Problem Relation Age of Onset  . Thyroid disease Neg Hx   . Hyperlipidemia Father   . Alcoholism Maternal Uncle   . Prostate cancer Paternal Uncle   . Hypertension Father   . Mental illness Maternal Uncle    Social History  Substance Use Topics  . Smoking status: Light Tobacco Smoker  . Smokeless tobacco: Never Used   Comment: 1 per day, on weekend 4-5 cig  . Alcohol use 0.0 oz/week      Review of Systems  Constitutional: Negative for fatigue and unexpected weight change.  Respiratory: Negative for shortness of breath.   Cardiovascular: Negative for chest pain, palpitations and leg swelling.  Psychiatric/Behavioral: Positive for dysphoric mood. Negative for self-injury, sleep disturbance and suicidal ideas. The patient is nervous/anxious.        Objective:   Physical Exam  Constitutional: He is oriented to person, place, and time. He appears well-developed and well-nourished. No distress.  Obese.   HENT:  Head: Normocephalic and atraumatic.  Eyes: Conjunctivae are normal.  Cardiovascular: Normal rate.   Pulmonary/Chest: Effort normal.  Neurological: He is alert and oriented to person, place, and time.  Skin: He is not diaphoretic.  Psychiatric: His speech is normal and behavior is normal. Judgment and thought content normal. His mood appears anxious.  Vitals reviewed.  BP (!) 170/110 (BP Location: Left Arm, Patient Position: Sitting, Cuff Size: Normal)   Pulse (!) 101   Temp 98.1 F (36.7 C) (Oral)   Ht 6' (1.829 m)   Wt 238 lb (108 kg)   SpO2 96%   BMI 32.28 kg/m  Wt Readings from Last 3 Encounters:  05/06/17 238 lb (108 kg)  05/24/16 240 lb (108.9 kg)  03/04/16 239 lb (108.4 kg)   BP Readings from Last 3 Encounters:  05/06/17 (!) 170/110  05/24/16  116/74  03/04/16 124/86  Recheck BP- 160/98 Depression screen PHQ 2/9 05/06/2017  Decreased Interest 1  Down, Depressed, Hopeless 2  PHQ - 2 Score 3  Altered sleeping 1  Tired, decreased energy 2  Change in appetite 2  Feeling bad or failure about yourself  2  Trouble concentrating 2  Moving slowly or fidgety/restless 0  Suicidal thoughts 0  PHQ-9 Score 12       Assessment & Plan:  1. Hyperthyroidism - discussed importance of follow up with endocrine and will check labs today - T4, free - T3, free - TSH - Comprehensive  metabolic panel - CBC  2. Anxiety - sounds like worsening of underlying issue, likely exacerbated by untreated hyperthyroidism, discussed that short term use of benzo not curative, suggested therapist (provided information to make appointment). - Comprehensive metabolic panel - CBC - clonazePAM (KLONOPIN) 0.5 MG tablet; Take 1 tablet (0.5 mg total) by mouth 2 (two) times daily as needed for anxiety.  Dispense: 30 tablet; Refill: 0  3. Essential hypertension - T4, free - T3, free - TSH - Comprehensive metabolic panel - CBC  4. Graves disease - needs follow up with endocrine   Olean Ree, FNP-BC  [facility], [facility]  05/08/2017 8:42 PM

## 2017-05-06 NOTE — Patient Instructions (Addendum)
Look at [facility]- [phone] Or [facility]  Follow up with Dr. Birdie Sons in 1 month   Living With Anxiety After being diagnosed with an anxiety disorder, you may be relieved to know why you have felt or behaved a certain way. It is natural to also feel overwhelmed about the treatment ahead and what it will mean for your life. With care and support, you can manage this condition and recover from it. How to cope with anxiety Dealing with stress Stress is your body's reaction to life changes and events, both good and bad. Stress can last just a few hours or it can be ongoing. Stress can play a major role in anxiety, so it is important to learn both how to cope with stress and how to think about it differently. Talk with your health care provider or a counselor to learn more about stress reduction. He or she may suggest some stress reduction techniques, such as:  Music therapy. This can include creating or listening to music that you enjoy and that inspires you.  Mindfulness-based meditation. This involves being aware of your normal breaths, rather than trying to control your breathing. It can be done while sitting or walking.  Centering prayer. This is a kind of meditation that involves focusing on a word, phrase, or sacred image that is meaningful to you and that brings you peace.  Deep breathing. To do this, expand your stomach and inhale slowly through your nose. Hold your breath for 3-5 seconds. Then exhale slowly, allowing your stomach muscles to relax.  Self-talk. This is a skill where you identify thought patterns that lead to anxiety reactions and correct those thoughts.  Muscle relaxation. This involves tensing muscles then relaxing them.  Choose a stress reduction technique that fits your lifestyle and personality. Stress reduction techniques take time and practice. Set aside 5-15 minutes a day to do them. Therapists can offer training in these techniques.  The training may be covered by some insurance plans. Other things you can do to manage stress include:  Keeping a stress diary. This can help you learn what triggers your stress and ways to control your response.  Thinking about how you respond to certain situations. You may not be able to control everything, but you can control your reaction.  Making time for activities that help you relax, and not feeling guilty about spending your time in this way.  Therapy combined with coping and stress-reduction skills provides the best chance for successful treatment. Medicines Medicines can help ease symptoms. Medicines for anxiety include:  Anti-anxiety drugs.  Antidepressants.  Beta-blockers.  Medicines may be used as the main treatment for anxiety disorder, along with therapy, or if other treatments are not working. Medicines should be prescribed by a health care provider. Relationships Relationships can play a big part in helping you recover. Try to spend more time connecting with trusted friends and family members. Consider going to couples counseling, taking family education classes, or going to family therapy. Therapy can help you and others better understand the condition. How to recognize changes in your condition Everyone has a different response to treatment for anxiety. Recovery from anxiety happens when symptoms decrease and stop interfering with your daily activities at home or work. This may mean that you will start to:  Have better concentration and focus.  Sleep better.  Be less irritable.  Have more energy.  Have improved memory.  It is important to recognize when your condition is getting  worse. Contact your health care provider if your symptoms interfere with home or work and you do not feel like your condition is improving. Where to find help and support: You can get help and support from these sources:  Self-help groups.  Online and [employer] organizations.  A  trusted spiritual leader.  Couples counseling.  Family education classes.  Family therapy.  Follow these instructions at home:  Eat a healthy diet that includes plenty of vegetables, fruits, whole grains, low-fat dairy products, and lean protein. Do not eat a lot of foods that are high in solid fats, added sugars, or salt.  Exercise. Most adults should do the following: ? Exercise for at least 150 minutes each week. The exercise should increase your heart rate and make you sweat (moderate-intensity exercise). ? Strengthening exercises at least twice a week.  Cut down on caffeine, tobacco, alcohol, and other potentially harmful substances.  Get the right amount and quality of sleep. Most adults need 7-9 hours of sleep each night.  Make choices that simplify your life.  Take over-the-counter and prescription medicines only as told by your health care provider.  Avoid caffeine, alcohol, and certain over-the-counter cold medicines. These may make you feel worse. Ask your pharmacist which medicines to avoid.  Keep all follow-up visits as told by your health care provider. This is important. Questions to ask your health care provider  Would I benefit from therapy?  How often should I follow up with a health care provider?  How long do I need to take medicine?  Are there any long-term side effects of my medicine?  Are there any alternatives to taking medicine? Contact a health care provider if:  You have a hard time staying focused or finishing daily tasks.  You spend many hours a day feeling worried about everyday life.  You become exhausted by worry.  You start to have headaches, feel tense, or have nausea.  You urinate more than normal.  You have diarrhea. Get help right away if:  You have a racing heart and shortness of breath.  You have thoughts of hurting yourself or others. If you ever feel like you may hurt yourself or others, or have thoughts about taking  your own life, get help right away. You can go to your nearest emergency department or call:  Your local emergency services (911 in the [place]  A suicide crisis helpline, such as the National Suicide Prevention Lifeline at 478-428-61051-587-375-4745. This is open 24-hours a day.  Summary  Taking steps to deal with stress can help calm you.  Medicines cannot cure anxiety disorders, but they can help ease symptoms.  Family, friends, and partners can play a big part in helping you recover from an anxiety disorder. This information is not intended to replace advice given to you by your health care provider. Make sure you discuss any questions you have with your health care provider. Document Released: 11/02/2016 Document Revised: 11/02/2016 Document Reviewed: 11/02/2016 Elsevier Interactive Patient Education  [employer]2018 Elsevier Inc.

## 2017-05-07 ENCOUNTER — Encounter: Payer: Self-pay | Admitting: Family Medicine

## 2017-05-07 DIAGNOSIS — E0500 Thyrotoxicosis with diffuse goiter without thyrotoxic crisis or storm: Secondary | ICD-10-CM

## 2017-05-07 HISTORY — DX: Thyrotoxicosis with diffuse goiter without thyrotoxic crisis or storm: E05.00

## 2017-05-08 ENCOUNTER — Encounter: Payer: Self-pay | Admitting: Family Medicine

## 2017-05-08 MED ORDER — METHIMAZOLE 10 MG PO TABS: 10.0000 mg | ORAL_TABLET | Freq: Every day | ORAL | 0 refills | Status: DC

## 2017-05-08 NOTE — Addendum Note (Signed)
Addended by: Olean ReeGESSNER, Ralyn Stlaurent B on: 05/08/2017 09:25 PM   Modules accepted: Orders

## 2017-05-23 ENCOUNTER — Ambulatory Visit (INDEPENDENT_AMBULATORY_CARE_PROVIDER_SITE_OTHER): Payer: 59 | Admitting: Family Medicine

## 2017-05-23 ENCOUNTER — Encounter: Payer: Self-pay | Admitting: Family Medicine

## 2017-05-23 VITALS — BP 140/82 | HR 83 | Temp 98.2°F | Wt 239.2 lb

## 2017-05-23 DIAGNOSIS — F329 Major depressive disorder, single episode, unspecified: Secondary | ICD-10-CM | POA: Diagnosis not present

## 2017-05-23 DIAGNOSIS — F32A Depression, unspecified: Secondary | ICD-10-CM

## 2017-05-23 DIAGNOSIS — R7309 Other abnormal glucose: Secondary | ICD-10-CM | POA: Diagnosis not present

## 2017-05-23 DIAGNOSIS — R079 Chest pain, unspecified: Secondary | ICD-10-CM

## 2017-05-23 DIAGNOSIS — R7303 Prediabetes: Secondary | ICD-10-CM | POA: Insufficient documentation

## 2017-05-23 DIAGNOSIS — I10 Essential (primary) hypertension: Secondary | ICD-10-CM

## 2017-05-23 DIAGNOSIS — E0590 Thyrotoxicosis, unspecified without thyrotoxic crisis or storm: Secondary | ICD-10-CM | POA: Diagnosis not present

## 2017-05-23 DIAGNOSIS — F419 Anxiety disorder, unspecified: Secondary | ICD-10-CM

## 2017-05-23 MED ORDER — CLONAZEPAM 0.5 MG PO TABS
0.5000 mg | ORAL_TABLET | Freq: Two times a day (BID) | ORAL | 0 refills | Status: DC | PRN
Start: 2017-05-23 — End: 2017-06-21

## 2017-05-23 MED ORDER — HYDROCHLOROTHIAZIDE 12.5 MG PO TABS: 12.5000 mg | ORAL_TABLET | Freq: Every day | ORAL | 1 refills | Status: DC

## 2017-05-23 NOTE — Assessment & Plan Note (Addendum)
Still above goal though improved from last check in the office. Discussed goal of less than 130/80. Discussed dietary changes to help with this. We will add HCTZ. He'll follow-up in a month.

## 2017-05-23 NOTE — Assessment & Plan Note (Signed)
Back on methimazole. He will continue to follow with endocrinology.

## 2017-05-23 NOTE — Progress Notes (Signed)
Edward Alar, MD Phone: [phone]  Edward Hurley is a 41 y.o. male who presents today for follow-up.  Hyperthyroidism: Has followed up with endocrinology. Appears they recommended definitive treatment with radioactive iodine. Currently back on methimazole. Has noticed some improvement in his symptoms. Less sweating.  Anxiety/depression: Notes anxiety has worsened since I last saw him. He had a breakdown while at work and has viral at times. Does report that he describes as panic attacks. Lots of anxiety. He'll notice central chest discomfort that will last for 30-45 minutes and he is off on its own. Occasionally has minimal shortness of breath with this. No radiation or diaphoresis. He will get up and pace and occasionally gets a little worse. No cardiac history. He does note the Klonopin has been helpful and he takes this once a day. He is also on Zoloft. He thinks he is ready to start seeing a therapist.  Hypertension: Not checking at home. Is taking atenolol.   PMHSmoker   ROS see history of present illness  Objective  Physical Exam Vitals:   05/23/17 1504  BP: 140/82  Pulse: 83  Temp: 98.2 F (36.8 C)    BP Readings from Last 3 Encounters:  05/23/17 140/82  05/06/17 (!) 170/110  05/24/16 116/74   Wt Readings from Last 3 Encounters:  05/23/17 239 lb 3.2 oz (108.5 kg)  05/06/17 238 lb (108 kg)  05/24/16 240 lb (108.9 kg)    Physical Exam  Constitutional: No distress.  Cardiovascular: Normal rate, regular rhythm and normal heart sounds.   Pulmonary/Chest: Effort normal and breath sounds normal.  Musculoskeletal: He exhibits no edema.  Neurological: He is alert. Gait normal.  Skin: Skin is warm and dry. He is not diaphoretic.   EKG: Normal sinus rhythm, rate 75, RSR prime, no ST or T-wave changes  Assessment/Plan: Please see individual problem list.  Hyperthyroidism Back on methimazole. He will continue to follow with endocrinology.  Essential  hypertension, benign Still above goal though improved from last check in the office. Discussed goal of less than 130/80. Discussed dietary changes to help with this. We will add HCTZ. He'll follow-up in a month.  Elevated glucose Previously elevated. Plan on checking A1c today.  Anxiety and depression Anxiety has worsened since we last saw each other about a year ago. Also somewhat depressed. Has been on Zoloft. Recently started on Klonopin by a nurse practitioner at one of her other offices. He notes this is been beneficial. The chest discomfort he describes could be related to panic attacks though he does have some risk factors for cardiac disease. EKG with no ischemic changes. Suspect related to anxiety. He will monitor and if changes or worsens will be reevaluated. We'll refill his Klonopin. We'll refer to therapy. Given return precautions.   Orders Placed This Encounter  Procedures  . HgB A1c  . Ambulatory referral to Psychology    Referral Priority:   Routine    Referral Type:   Psychiatric    Referral Reason:   Specialty Services Required    Requested Specialty:   Psychology    Number of Visits Requested:   1  . EKG 12-Lead    Meds ordered this encounter  Medications  . clonazePAM (KLONOPIN) 0.5 MG tablet    Sig: Take 1 tablet (0.5 mg total) by mouth 2 (two) times daily as needed for anxiety.    Dispense:  30 tablet    Refill:  0  . hydrochlorothiazide (HYDRODIURIL) 12.5 MG tablet  Sig: Take 1 tablet (12.5 mg total) by mouth daily.    Dispense:  90 tablet    Refill:  1   Edward AlarEric Ommie Degeorge, MD [facility] [place]

## 2017-05-23 NOTE — Patient Instructions (Signed)
Nice to see you. We'll check an A1c today. We'll refer you to a psychologist. Please work on dietary changes for your blood pressure. Please be weary when taking the Klonopin as it may make you drowsy.

## 2017-05-23 NOTE — Assessment & Plan Note (Signed)
Previously elevated. Plan on checking A1c today.

## 2017-05-23 NOTE — Assessment & Plan Note (Addendum)
Anxiety has worsened since we last saw each other about a year ago. Also somewhat depressed. Has been on Zoloft. Recently started on Klonopin by a nurse practitioner at one of her other offices. He notes this is been beneficial. The chest discomfort he describes could be related to panic attacks though he does have some risk factors for cardiac disease. EKG with no ischemic changes. Suspect related to anxiety. He will monitor and if changes or worsens will be reevaluated. We'll refill his Klonopin. We'll refer to therapy. Given return precautions.

## 2017-05-24 LAB — HEMOGLOBIN A1C: HEMOGLOBIN A1C: 6 % (ref 4.6–6.5)

## 2017-05-27 ENCOUNTER — Telehealth: Payer: Self-pay | Admitting: Family Medicine

## 2017-05-27 ENCOUNTER — Other Ambulatory Visit: Payer: Self-pay | Admitting: Family Medicine

## 2017-05-27 NOTE — Telephone Encounter (Signed)
Pt called to follow up on the FMLA paperwork. Please advise? Once it's ready please call pt. Thank you!  Call pt @ [phone].

## 2017-05-27 NOTE — Telephone Encounter (Signed)
Do we know the status on patients FMLA?

## 2017-05-31 DIAGNOSIS — Z0279 Encounter for issue of other medical certificate: Secondary | ICD-10-CM

## 2017-05-31 NOTE — Telephone Encounter (Signed)
Completed and given to Jessica.  

## 2017-05-31 NOTE — Telephone Encounter (Signed)
Patient notified form is ready to be picked up.

## 2017-06-05 ENCOUNTER — Encounter: Payer: Self-pay | Admitting: Family Medicine

## 2017-06-21 ENCOUNTER — Other Ambulatory Visit: Payer: Self-pay | Admitting: Family Medicine

## 2017-06-21 DIAGNOSIS — F419 Anxiety disorder, unspecified: Secondary | ICD-10-CM

## 2017-06-21 MED ORDER — CLONAZEPAM 0.5 MG PO TABS: 0.5000 mg | ORAL_TABLET | Freq: Two times a day (BID) | ORAL | 0 refills | Status: DC | PRN

## 2017-06-21 NOTE — Telephone Encounter (Signed)
Last OV 05/23/17 last filled 05/23/17 30 0rf

## 2017-06-21 NOTE — Telephone Encounter (Signed)
Please fax

## 2017-06-21 NOTE — Telephone Encounter (Signed)
faxed

## 2017-06-23 ENCOUNTER — Encounter: Payer: Self-pay | Admitting: Family Medicine

## 2017-06-29 ENCOUNTER — Encounter: Payer: Self-pay | Admitting: Family Medicine

## 2017-06-29 ENCOUNTER — Ambulatory Visit (INDEPENDENT_AMBULATORY_CARE_PROVIDER_SITE_OTHER): Payer: 59 | Admitting: Family Medicine

## 2017-06-29 VITALS — BP 130/88 | HR 69 | Temp 98.4°F | Resp 16 | Wt 239.4 lb

## 2017-06-29 DIAGNOSIS — F32A Depression, unspecified: Secondary | ICD-10-CM

## 2017-06-29 DIAGNOSIS — F419 Anxiety disorder, unspecified: Secondary | ICD-10-CM

## 2017-06-29 DIAGNOSIS — F329 Major depressive disorder, single episode, unspecified: Secondary | ICD-10-CM | POA: Diagnosis not present

## 2017-06-29 DIAGNOSIS — R7303 Prediabetes: Secondary | ICD-10-CM

## 2017-06-29 DIAGNOSIS — I10 Essential (primary) hypertension: Secondary | ICD-10-CM | POA: Diagnosis not present

## 2017-06-29 LAB — BASIC METABOLIC PANEL
BUN: 19 mg/dL (ref 6–23)
CO2: 32 meq/L (ref 19–32)
Calcium: 9.7 mg/dL (ref 8.4–10.5)
Chloride: 103 mEq/L (ref 96–112)
Creatinine, Ser: 1.04 mg/dL (ref 0.40–1.50)
GFR: 84.24 mL/min (ref >60.00)
GLUCOSE: 144 mg/dL — AB (ref 70–99)
POTASSIUM: 3.5 meq/L (ref 3.5–5.1)
SODIUM: 140 meq/L (ref 135–145)

## 2017-06-29 MED ORDER — HYDROCHLOROTHIAZIDE 25 MG PO TABS: 25.0000 mg | ORAL_TABLET | Freq: Every day | ORAL | 1 refills | Status: DC

## 2017-06-29 NOTE — Assessment & Plan Note (Signed)
Discussed diet and exercise. Dietary information given.

## 2017-06-29 NOTE — Assessment & Plan Note (Addendum)
Continues to have issues with this though is somewhat improved. He'll continue his current medication regimen. I encouraged him to contact the therapist. Edward LabradorWe'll complete his form and fax it.

## 2017-06-29 NOTE — Progress Notes (Signed)
  Edward AlarEric Quanesha Klimaszewski, MD Phone: [phone]  Edward [place]Michael [place] Alona Hurley is a 41 y.o. male who presents today for f/u.  Hypertension: Notes his blood pressure fluctuates though about half the time is higher than it is today up into the 140s over 90s. Takes atenolol and hydrochlorothiazide. No chest pain or shortness of breath.  Anxiety/depression: his anxiety has gotten better as he has gotten away from work. He resigned on 06/13/17. 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 Notes he takes Klonopin twice daily. Also taking Zoloft. He has not seen a therapist yet. Slight depression at times. No SI or HI.  Prediabetes: Recently diagnosed with this. A1c was 6.0. Notes he has decreased his soda intake significantly. Has not worked on other dietary aspects. He is not doing much exercise currently.  PMH: Smoker   ROS see history of present illness  Objective  Physical Exam Vitals:   06/29/17 1400  BP: 130/88  Pulse: 69  Resp: 16  Temp: 98.4 F (36.9 C)    BP Readings from Last 3 Encounters:  06/29/17 130/88  05/23/17 140/82  05/06/17 (!) 170/110   Wt Readings from Last 3 Encounters:  06/29/17 239 lb 6 oz (108.6 kg)  05/23/17 239 lb 3.2 oz (108.5 kg)  05/06/17 238 lb (108 kg)    Physical Exam  Constitutional: No distress.  Cardiovascular: Normal rate, regular rhythm and normal heart sounds.   Pulmonary/Chest: Effort normal and breath sounds normal.  Skin: Skin is warm and dry. He is not diaphoretic.  Psychiatric:  Mood anxious, affect mildly anxious     Assessment/Plan: Please see individual problem list.  Essential hypertension, benign Still slightly above goal. We'll check a BMP today. If no abnormalities we will have him go up to 25 mg of hydrochlorothiazide and recheck in 1 month.  Anxiety and depression Continues to have issues with this though is somewhat improved. He'll continue his current medication regimen. I encouraged him to contact the therapist. Edward LabradorWe'll complete  his form and fax it.  Prediabetes Discussed diet and exercise. Dietary information given.   Orders Placed This Encounter  Procedures  . Basic Metabolic Panel (BMET)    Meds ordered this encounter  Medications  . hydrochlorothiazide (HYDRODIURIL) 25 MG tablet    Sig: Take 1 tablet (25 mg total) by mouth daily.    Dispense:  90 tablet    Refill:  1   Edward AlarEric Denzel Etienne, MD [facility] [place]

## 2017-06-29 NOTE — Patient Instructions (Signed)
Nice to see you. We will increase your hydrochlorothiazide. You may take two 12.5 mg tablets daily until you use up your current prescription. Then start taking the new prescription. Please work on diet and exercise. Exercise goals are 30 minutes 5 days a week or a total of 150 minutes weekly. Dietary guidelines are included below.  Diet Recommendations  Starchy (carb) foods: Bread, rice, pasta, potatoes, corn, cereal, grits, crackers, bagels, muffins, all baked goods.  (Fruits, milk, and yogurt also have carbohydrate, but most of these foods will not spike your blood sugar as the starchy foods will.)  A few fruits do cause high blood sugars; use small portions of bananas (limit to 1/2 at a time), grapes, watermelon, oranges, and most tropical fruits.    Protein foods: Meat, fish, poultry, eggs, dairy foods, and beans such as pinto and kidney beans (beans also provide carbohydrate).   1. Eat at least 3 meals and 1-2 snacks per day. Never go more than 4-5 hours while awake without eating. Eat breakfast within the first hour of getting up.   2. Limit starchy foods to TWO per meal and ONE per snack. ONE portion of a starchy  food is equal to the following:   - ONE slice of bread (or its equivalent, such as half of a hamburger bun).   - 1/2 cup of a "scoopable" starchy food such as potatoes or rice.   - 15 grams of carbohydrate as shown on food label.  3. Include at every meal: a protein food, a carb food, and vegetables and/or fruit.   - Obtain twice the volume of veg's as protein or carbohydrate foods for both lunch and dinner.   - Fresh or frozen veg's are best.   - Keep frozen veg's on hand for a quick vegetable serving.

## 2017-06-29 NOTE — Assessment & Plan Note (Signed)
Still slightly above goal. We'll check a BMP today. If no abnormalities we will have him go up to 25 mg of hydrochlorothiazide and recheck in 1 month.

## 2017-06-30 ENCOUNTER — Other Ambulatory Visit: Payer: Self-pay | Admitting: Family Medicine

## 2017-06-30 DIAGNOSIS — Z5181 Encounter for therapeutic drug level monitoring: Secondary | ICD-10-CM

## 2017-07-01 ENCOUNTER — Other Ambulatory Visit: Payer: Self-pay | Admitting: Internal Medicine

## 2017-07-01 DIAGNOSIS — E0500 Thyrotoxicosis with diffuse goiter without thyrotoxic crisis or storm: Secondary | ICD-10-CM

## 2017-07-06 ENCOUNTER — Other Ambulatory Visit: Payer: Self-pay

## 2017-07-11 ENCOUNTER — Encounter: Payer: Self-pay | Admitting: Family Medicine

## 2017-07-11 ENCOUNTER — Other Ambulatory Visit: Payer: Self-pay | Admitting: Family Medicine

## 2017-07-11 DIAGNOSIS — F419 Anxiety disorder, unspecified: Secondary | ICD-10-CM

## 2017-07-11 NOTE — Telephone Encounter (Signed)
faxed

## 2017-07-11 NOTE — Telephone Encounter (Signed)
Last OV 06/29/17 last filled 06/21/17 30 0rf

## 2017-07-20 ENCOUNTER — Telehealth: Payer: Self-pay | Admitting: Family Medicine

## 2017-07-20 NOTE — Telephone Encounter (Signed)
Pt called office, he was wondering if the paper work has been completed. Would you please call him.  Thanks

## 2017-07-20 NOTE — Telephone Encounter (Signed)
Form completed.  Please fax.

## 2017-07-21 ENCOUNTER — Encounter
Admission: RE | Admit: 2017-07-21 | Discharge: 2017-07-21 | Disposition: A | Discharge status: Home / Self Care | Payer: 59 | Source: Ambulatory Visit | Attending: Internal Medicine | Admitting: Internal Medicine

## 2017-07-21 DIAGNOSIS — E0500 Thyrotoxicosis with diffuse goiter without thyrotoxic crisis or storm: Secondary | ICD-10-CM | POA: Diagnosis present

## 2017-07-21 MED ORDER — SODIUM IODIDE I-123 7.4 MBQ CAPS
150.6000 | ORAL_CAPSULE | Freq: Once | ORAL | Status: AC
  Administered 2017-07-21: 150.6 via ORAL

## 2017-07-21 NOTE — Telephone Encounter (Signed)
faxed

## 2017-07-22 ENCOUNTER — Encounter: Admission: RE | Admit: 2017-07-22 | Payer: 59 | Source: Ambulatory Visit

## 2017-07-26 ENCOUNTER — Encounter
Admission: RE | Admit: 2017-07-26 | Discharge: 2017-07-26 | Disposition: A | Discharge status: Home / Self Care | Payer: 59 | Source: Ambulatory Visit | Attending: Internal Medicine | Admitting: Internal Medicine

## 2017-07-26 DIAGNOSIS — E0500 Thyrotoxicosis with diffuse goiter without thyrotoxic crisis or storm: Secondary | ICD-10-CM

## 2017-07-26 MED ORDER — SODIUM IODIDE I 131 CAPSULE
16.3900 | Freq: Once | INTRAVENOUS | Status: AC | PRN
  Administered 2017-07-26: 16.39 via ORAL

## 2017-08-12 ENCOUNTER — Other Ambulatory Visit: Payer: Self-pay | Admitting: Family Medicine

## 2017-08-12 DIAGNOSIS — F419 Anxiety disorder, unspecified: Secondary | ICD-10-CM

## 2017-08-12 NOTE — Telephone Encounter (Signed)
Last OV 06/29/2017 Next OV 09/30/2017 Last refill 07/11/2017

## 2017-08-12 NOTE — Telephone Encounter (Signed)
Phoned in.

## 2017-08-13 ENCOUNTER — Other Ambulatory Visit: Payer: Self-pay | Admitting: Family Medicine

## 2017-08-13 DIAGNOSIS — F419 Anxiety disorder, unspecified: Secondary | ICD-10-CM

## 2017-08-15 NOTE — Telephone Encounter (Signed)
This was phoned in last week. Please check with pharmacy to see if they received it. Thanks.

## 2017-08-15 NOTE — Telephone Encounter (Signed)
Last OV was 06/29/17, last refill was 08/12/17, #30.  Please advise, thanks

## 2017-08-16 ENCOUNTER — Other Ambulatory Visit: Payer: Self-pay | Admitting: Family Medicine

## 2017-08-16 NOTE — Telephone Encounter (Signed)
Thanks. Not sure how they did not get this as I called it in, though thanks for completing.

## 2017-08-16 NOTE — Telephone Encounter (Signed)
Spoke with the pharmacy, it was not called in they had no rx for this, I did complete over the phone for the #30. thanks

## 2017-08-22 ENCOUNTER — Telehealth: Payer: Self-pay | Admitting: *Deleted

## 2017-08-22 NOTE — Telephone Encounter (Signed)
Please advise 

## 2017-08-22 NOTE — Telephone Encounter (Signed)
Pt hs requested a update on a [employer] report for short term disability.  Pt contact [phone]

## 2017-08-28 NOTE — Telephone Encounter (Signed)
Completed.

## 2017-08-29 NOTE — Telephone Encounter (Signed)
faxed

## 2017-09-01 ENCOUNTER — Other Ambulatory Visit: Payer: Self-pay | Admitting: Family Medicine

## 2017-09-01 DIAGNOSIS — F419 Anxiety disorder, unspecified: Secondary | ICD-10-CM

## 2017-09-01 NOTE — Telephone Encounter (Signed)
faxed

## 2017-09-01 NOTE — Telephone Encounter (Signed)
last filled on 9/21, #30, please advise for refills, thanks

## 2017-09-01 NOTE — Telephone Encounter (Signed)
Please fax

## 2017-09-19 ENCOUNTER — Other Ambulatory Visit: Payer: Self-pay | Admitting: Family Medicine

## 2017-09-19 DIAGNOSIS — F419 Anxiety disorder, unspecified: Secondary | ICD-10-CM

## 2017-09-19 NOTE — Telephone Encounter (Signed)
Please fax

## 2017-09-19 NOTE — Telephone Encounter (Signed)
Last OV 06/29/2017 Next OV 09/30/2017 Last refill 09/01/2017

## 2017-09-20 NOTE — Telephone Encounter (Signed)
Last OV was 06/29/17, last refill was

## 2017-09-20 NOTE — Telephone Encounter (Signed)
faxed

## 2017-09-30 ENCOUNTER — Ambulatory Visit: Payer: Self-pay | Admitting: Family Medicine

## 2017-09-30 ENCOUNTER — Encounter: Payer: Self-pay | Admitting: Family Medicine

## 2017-09-30 DIAGNOSIS — Z0289 Encounter for other administrative examinations: Secondary | ICD-10-CM

## 2017-10-29 ENCOUNTER — Other Ambulatory Visit: Payer: Self-pay | Admitting: Family Medicine

## 2017-10-29 DIAGNOSIS — F419 Anxiety disorder, unspecified: Secondary | ICD-10-CM

## 2017-11-03 ENCOUNTER — Other Ambulatory Visit: Payer: Self-pay | Admitting: Family Medicine

## 2017-11-04 DIAGNOSIS — E890 Postprocedural hypothyroidism: Secondary | ICD-10-CM | POA: Insufficient documentation

## 2017-11-04 NOTE — Telephone Encounter (Signed)
Last OV 06/29/17 last filled 09/19/17

## 2017-11-04 NOTE — Telephone Encounter (Signed)
Refill can be given.  Please fax.  Patient needs to set up follow-up.  Thanks.

## 2017-11-08 ENCOUNTER — Telehealth: Payer: Self-pay

## 2017-11-08 NOTE — Telephone Encounter (Signed)
Copied from CRM [id number]. Topic: General - Other >> Nov 08, 2017 11:49 AM Lelon FrohlichGolden, Emanie Behan, RMA wrote: Reason for CRM: Pt called and stated that he had missed a call from [place] and was returning her call

## 2017-11-08 NOTE — Telephone Encounter (Signed)
Please advise 

## 2017-11-08 NOTE — Telephone Encounter (Signed)
Left message to notify patient he needs to schedule a follow up appointment for further refills

## 2017-12-13 ENCOUNTER — Other Ambulatory Visit: Payer: Self-pay | Admitting: Family Medicine

## 2017-12-13 DIAGNOSIS — F419 Anxiety disorder, unspecified: Secondary | ICD-10-CM

## 2017-12-13 NOTE — Telephone Encounter (Signed)
Last OV 06/29/17 last filled 11/04/17 30 0rf

## 2017-12-15 NOTE — Telephone Encounter (Signed)
Refill can be faxed.  He needs to set up follow-up for further refills.

## 2017-12-19 ENCOUNTER — Encounter: Payer: Self-pay | Admitting: *Deleted

## 2017-12-19 NOTE — Telephone Encounter (Signed)
Sent mychart message

## 2017-12-24 ENCOUNTER — Telehealth: Payer: 59 | Admitting: Nurse Practitioner

## 2017-12-24 DIAGNOSIS — Z20828 Contact with and (suspected) exposure to other viral communicable diseases: Secondary | ICD-10-CM

## 2017-12-24 MED ORDER — OSELTAMIVIR PHOSPHATE 75 MG PO CAPS: 75.0000 mg | ORAL_CAPSULE | Freq: Every day | ORAL | 0 refills | Status: DC

## 2017-12-24 NOTE — Progress Notes (Signed)

## 2018-01-25 ENCOUNTER — Other Ambulatory Visit: Payer: Self-pay | Admitting: Family Medicine

## 2018-01-25 DIAGNOSIS — F419 Anxiety disorder, unspecified: Secondary | ICD-10-CM

## 2018-01-25 NOTE — Telephone Encounter (Signed)
Sent to pharmacy.  Patient needs to follow-up to get further refills of this.  If he does not follow-up I will provide a tapering dose and will no longer refill this medication.

## 2018-01-25 NOTE — Telephone Encounter (Signed)
Last OV 06/29/17 last filled 12/15/17 30 0rf

## 2018-01-26 NOTE — Telephone Encounter (Signed)
Left message to return cal, ok for pec to speak to patient and schedule follow up for next fill.

## 2018-02-03 NOTE — Telephone Encounter (Signed)
Unable to reach per phone. Sent letter to schedule follow up

## 2018-03-21 ENCOUNTER — Other Ambulatory Visit: Payer: Self-pay | Admitting: Internal Medicine

## 2018-04-07 ENCOUNTER — Other Ambulatory Visit: Payer: Self-pay | Admitting: Family Medicine

## 2018-04-07 DIAGNOSIS — F419 Anxiety disorder, unspecified: Secondary | ICD-10-CM

## 2018-04-07 NOTE — Telephone Encounter (Signed)
Multiple attempts made to get patient to schedule follow  Up last OV 11/18

## 2018-04-07 NOTE — Telephone Encounter (Signed)
I have provided the patient with a tapering prescription for his clonazepam given that he has not followed up in 9 months.  I have also included in the notes to the pharmacy that he will need to follow-up for further refills.  Please attempt to contact him again to set up follow-up.

## 2018-04-14 ENCOUNTER — Ambulatory Visit (INDEPENDENT_AMBULATORY_CARE_PROVIDER_SITE_OTHER): Payer: 59 | Admitting: Family Medicine

## 2018-04-14 ENCOUNTER — Encounter: Payer: Self-pay | Admitting: Family Medicine

## 2018-04-14 VITALS — BP 122/88 | HR 52 | Temp 97.5°F | Wt 239.0 lb

## 2018-04-14 DIAGNOSIS — F419 Anxiety disorder, unspecified: Secondary | ICD-10-CM

## 2018-04-14 DIAGNOSIS — F329 Major depressive disorder, single episode, unspecified: Secondary | ICD-10-CM | POA: Diagnosis not present

## 2018-04-14 DIAGNOSIS — I10 Essential (primary) hypertension: Secondary | ICD-10-CM

## 2018-04-14 DIAGNOSIS — E890 Postprocedural hypothyroidism: Secondary | ICD-10-CM | POA: Diagnosis not present

## 2018-04-14 DIAGNOSIS — F32A Depression, unspecified: Secondary | ICD-10-CM

## 2018-04-14 DIAGNOSIS — E039 Hypothyroidism, unspecified: Secondary | ICD-10-CM | POA: Insufficient documentation

## 2018-04-14 MED ORDER — SERTRALINE HCL 50 MG PO TABS: 150.0000 mg | ORAL_TABLET | Freq: Every day | ORAL | 1 refills | Status: DC

## 2018-04-14 MED ORDER — CLONAZEPAM 0.5 MG PO TABS: 0.5000 mg | ORAL_TABLET | Freq: Two times a day (BID) | ORAL | 0 refills | Status: DC | PRN

## 2018-04-14 MED ORDER — ATENOLOL 50 MG PO TABS: 50.0000 mg | ORAL_TABLET | Freq: Every day | ORAL | 1 refills | Status: DC

## 2018-04-14 NOTE — Progress Notes (Signed)
[name], MD Phone: [phone]  Terron Merfeld is a 42 y.o. male who presents today for f/u.  CC: htn, anxiety/depression  HYPERTENSION  Disease Monitoring  Home BP Monitoring well controlled, no numbers given Chest pain- no    Dyspnea- no Medications  Compliance-  Taking atenolol, stopped HCTZ when he ran out.  Edema- no  HYPOTHYROIDISM - s/p ablation for hyperthyroidism Disease Monitoring Weight changes: no  Skin Changes: no Heat/Cold intolerance: no Medication Monitoring Compliance:  Taking synthroid   Last TSH:  See care everywhere  Anxiety/depression: Patient notes this has improved though he does still have some symptoms.  The anxiety is hit or miss.  Some moments are bad and he just cannot get himself out of them.  He does feel some depressed on those days.  He does not take the Klonopin very often.  Does not make him drowsy.  He does not drink alcohol with the Klonopin.  He does remain on Zoloft.  He has not seen a therapist yet.  No SI.  Social History   Tobacco Use  Smoking Status Light Tobacco Smoker  Smokeless Tobacco Never Used  Tobacco Comment   1 per day, on weekend 4-5 cig     ROS see history of present illness  Objective  Physical Exam Vitals:   04/14/18 1317 04/14/18 1344  BP: 122/88   Pulse: (!) 48 (!) 52  Temp: (!) 97.5 F (36.4 C)   SpO2: 96%     BP Readings from Last 3 Encounters:  04/14/18 122/88  06/29/17 130/88  05/23/17 140/82   Wt Readings from Last 3 Encounters:  04/14/18 239 lb (108.4 kg)  06/29/17 239 lb 6 oz (108.6 kg)  05/23/17 239 lb 3.2 oz (108.5 kg)    Physical Exam  Constitutional: No distress.  Cardiovascular: Regular rhythm and normal heart sounds. Bradycardia present.  Pulmonary/Chest: Effort normal and breath sounds normal.  Musculoskeletal: He exhibits no edema.  Neurological: He is alert.  Skin: Skin is warm and dry. He is not diaphoretic.     Assessment/Plan: Please see individual problem  list.  Anxiety and depression Improved though still has some symptoms.  We will increase his Zoloft.  Will refer to a therapist.  We will refill his clonazepam.  He will follow-up in 3 months.  Hypothyroidism Post ablative hypothyroidism.  Endocrinology is managing this.  He is on Synthroid.  Essential hypertension, benign Seems to be well-controlled.  He is no longer on HCTZ.  His heart rate is over the normal.  We will decrease his atenolol dose to 50 mg daily.  He will return in 1 week for recheck of pulse and blood pressure.  Fasting labs in a week as well.   Orders Placed This Encounter  Procedures  . Lipid panel    Standing Status:   Future    Standing Expiration Date:   04/15/2019  . Comp Met (CMET)    Standing Status:   Future    Standing Expiration Date:   04/15/2019  . Hemoglobin A1c    Standing Status:   Future    Standing Expiration Date:   04/15/2019  . Ambulatory referral to Psychology    Referral Priority:   Routine    Referral Type:   Psychiatric    Referral Reason:   Specialty Services Required    Requested Specialty:   Psychology    Number of Visits Requested:   1    Meds ordered this encounter  Medications  . atenolol (  TENORMIN) 50 MG tablet    Sig: Take 1 tablet (50 mg total) by mouth daily.    Dispense:  90 tablet    Refill:  1  . sertraline (ZOLOFT) 50 MG tablet    Sig: Take 3 tablets (150 mg total) by mouth daily.    Dispense:  270 tablet    Refill:  1  . clonazePAM (KLONOPIN) 0.5 MG tablet    Sig: Take 1 tablet (0.5 mg total) by mouth 2 (two) times daily as needed for anxiety.    Dispense:  60 tablet    Refill:  0     [name], MD [place]

## 2018-04-14 NOTE — Patient Instructions (Signed)
Nice to see you. We will decrease your atenolol dose to 50 mg daily.  We will have you return in 1 week for recheck of pulse and blood pressure as well as fasting lab work. We will increase her Zoloft to 150 mg daily.  We will refer you to a therapist.

## 2018-04-14 NOTE — Assessment & Plan Note (Signed)
Seems to be well-controlled.  He is no longer on HCTZ.  His heart rate is over the normal.  We will decrease his atenolol dose to 50 mg daily.  He will return in 1 week for recheck of pulse and blood pressure.  Fasting labs in a week as well.

## 2018-04-14 NOTE — Assessment & Plan Note (Signed)
Improved though still has some symptoms.  We will increase his Zoloft.  Will refer to a therapist.  We will refill his clonazepam.  He will follow-up in 3 months.

## 2018-04-14 NOTE — Assessment & Plan Note (Signed)
Post ablative hypothyroidism.  Endocrinology is managing this.  He is on Synthroid.

## 2018-04-20 ENCOUNTER — Encounter: Payer: Self-pay | Admitting: *Deleted

## 2018-04-20 ENCOUNTER — Other Ambulatory Visit (INDEPENDENT_AMBULATORY_CARE_PROVIDER_SITE_OTHER): Payer: 59

## 2018-04-20 ENCOUNTER — Ambulatory Visit (INDEPENDENT_AMBULATORY_CARE_PROVIDER_SITE_OTHER): Payer: 59 | Admitting: *Deleted

## 2018-04-20 VITALS — BP 124/80 | HR 45 | Resp 16

## 2018-04-20 DIAGNOSIS — I10 Essential (primary) hypertension: Secondary | ICD-10-CM

## 2018-04-20 DIAGNOSIS — Z5181 Encounter for therapeutic drug level monitoring: Secondary | ICD-10-CM | POA: Diagnosis not present

## 2018-04-20 LAB — BASIC METABOLIC PANEL
BUN: 15 mg/dL (ref 6–23)
CALCIUM: 9.3 mg/dL (ref 8.4–10.5)
CO2: 29 mEq/L (ref 19–32)
CREATININE: 1.14 mg/dL (ref 0.40–1.50)
Chloride: 104 mEq/L (ref 96–112)
GFR: 75.46 mL/min (ref >60.00)
GLUCOSE: 82 mg/dL (ref 70–99)
Potassium: 3.6 mEq/L (ref 3.5–5.1)
Sodium: 140 mEq/L (ref 135–145)

## 2018-04-20 LAB — LIPID PANEL
CHOLESTEROL: 141 mg/dL (ref 0–200)
HDL: 41.2 mg/dL (ref >39.00)
LDL Cholesterol: 79 mg/dL (ref 0–99)
NONHDL: 99.49
Total CHOL/HDL Ratio: 3
Triglycerides: 103 mg/dL (ref 0.0–149.0)
VLDL: 20.6 mg/dL (ref 0.0–40.0)

## 2018-04-20 LAB — COMPREHENSIVE METABOLIC PANEL
ALBUMIN: 4.6 g/dL (ref 3.5–5.2)
ALT: 17 U/L (ref 0–53)
AST: 15 U/L (ref 0–37)
Alkaline Phosphatase: 38 U/L — ABNORMAL LOW (ref 39–117)
BUN: 15 mg/dL (ref 6–23)
CALCIUM: 9.3 mg/dL (ref 8.4–10.5)
CHLORIDE: 104 meq/L (ref 96–112)
CO2: 29 meq/L (ref 19–32)
Creatinine, Ser: 1.14 mg/dL (ref 0.40–1.50)
GFR: 75.46 mL/min (ref >60.00)
Glucose, Bld: 82 mg/dL (ref 70–99)
Potassium: 3.6 mEq/L (ref 3.5–5.1)
SODIUM: 140 meq/L (ref 135–145)
Total Bilirubin: 0.5 mg/dL (ref 0.2–1.2)
Total Protein: 7.3 g/dL (ref 6.0–8.3)

## 2018-04-20 LAB — HEMOGLOBIN A1C: Hgb A1c MFr Bld: 5.2 % (ref 4.6–6.5)

## 2018-04-20 NOTE — Progress Notes (Signed)
Pulse remains low.  Blood pressure well controlled.  Patient should discontinue the atenolol and follow-up in 1 week for BP and pulse check.

## 2018-04-20 NOTE — Progress Notes (Signed)
Patient one week recheck on BP and pulse after reducing atenolol to 50 mg. BP taken in left arm 122/80 pulse 47, waited additional 10 minutes BP taken in right arm 124/80 pulse 45. Full minute manual count of pulse was 48.

## 2018-04-25 ENCOUNTER — Telehealth: Payer: Self-pay | Admitting: *Deleted

## 2018-04-25 ENCOUNTER — Telehealth: Payer: Self-pay | Admitting: Family Medicine

## 2018-04-25 NOTE — Telephone Encounter (Signed)
Tried to reach patient  By phone to let him know Dr. Birdie SonsSonnenberg orders for BP. See copied message below from clinical visit dated 04/20/18. [name] nurse may advise and schedule.    Glori LuisSonnenberg, Eric G, MD at 04/20/2018 9:30 AM   Status: Signed    Pulse remains low.  Blood pressure well controlled.  Patient should discontinue the atenolol and follow-up in 1 week for BP and pulse check.

## 2018-04-25 NOTE — Telephone Encounter (Signed)
Copied from CRM [id number]. Topic: Inquiry >> Apr 25, 2018 12:34 PM Yvonna Alanisobinson, Andra M wrote: Reason for CRM: Patient called returning a call we left for him. Please call patient today.Marland Kitchen..Marland Kitchen

## 2018-04-25 NOTE — Telephone Encounter (Signed)
Patient notified and appointment made for next week  

## 2018-04-25 NOTE — Progress Notes (Signed)
Left message for patient to call office see telephone note.

## 2018-04-26 ENCOUNTER — Ambulatory Visit: Payer: Self-pay

## 2018-04-26 NOTE — Telephone Encounter (Signed)
See TE from 04-25-18

## 2018-04-26 NOTE — Progress Notes (Signed)
Left message to call office

## 2018-04-27 NOTE — Progress Notes (Signed)
Pt notified on 04/25/18 & nurse appt scheduled

## 2018-05-02 ENCOUNTER — Ambulatory Visit: Payer: Self-pay

## 2018-05-04 ENCOUNTER — Ambulatory Visit (INDEPENDENT_AMBULATORY_CARE_PROVIDER_SITE_OTHER): Payer: 59 | Admitting: *Deleted

## 2018-05-04 VITALS — BP 138/90 | HR 70

## 2018-05-04 DIAGNOSIS — I10 Essential (primary) hypertension: Secondary | ICD-10-CM

## 2018-05-04 NOTE — Progress Notes (Addendum)
Patient arrived for one week follow up after stopping atenolol for BP check, patient arrived PCP still in office at time of arrival. BP 140/100 pulse 70, allowed patient to rest for 15 minutes BP decreased to 138/90 pulse 70.   Reviewed above.  It appears that atenolol was recently stopped secondary to low pulse reate.  Pulse better.  Blood pressure elevated.  Will forward to Dr Birdie SonsSonnenberg to determine medication adjustment and further treatment.    Dr Lorin PicketScott

## 2018-05-05 MED ORDER — AMLODIPINE BESYLATE 5 MG PO TABS: 5.0000 mg | ORAL_TABLET | Freq: Every day | ORAL | 1 refills | Status: DC

## 2018-05-05 NOTE — Progress Notes (Signed)
Wanted you to see his blood pressure/pulse check.  Recently stopped atenolol.

## 2018-05-05 NOTE — Progress Notes (Signed)
Patient's blood pressure is above goal.  I would suggest that we consider adding back hydrochlorothiazide or potentially adding amlodipine.

## 2018-05-05 NOTE — Progress Notes (Signed)
Amlodipine sent to pharmacy. Needs BP check in 1 month.

## 2018-05-05 NOTE — Addendum Note (Signed)
Addended by: Birdie SonsSONNENBERG, ERIC G on: 05/05/2018 08:30 AM   Modules accepted: Orders

## 2018-05-05 NOTE — Addendum Note (Signed)
Addended by: Birdie SonsSONNENBERG, Maxi Carreras G on: 05/05/2018 10:02 AM   Modules accepted: Orders

## 2018-05-05 NOTE — Progress Notes (Signed)
Patient is willing to try amlodipine and [facility] [place]. Provide dose and I will handle and would you like recheck on BP in a couple of weeks?

## 2018-05-10 NOTE — Progress Notes (Signed)
BP check scheduled.

## 2018-06-05 ENCOUNTER — Ambulatory Visit: Payer: Self-pay

## 2018-06-09 ENCOUNTER — Other Ambulatory Visit: Payer: Self-pay | Admitting: Family Medicine

## 2018-06-09 DIAGNOSIS — F419 Anxiety disorder, unspecified: Secondary | ICD-10-CM

## 2018-06-09 NOTE — Telephone Encounter (Signed)
Refilled: 04/14/2018 Last OV: 04/14/2018 Next OV: 07/17/2018

## 2018-06-10 NOTE — Telephone Encounter (Signed)
Refill sent to pharmacy.  Drug database reviewed. 

## 2018-07-17 ENCOUNTER — Ambulatory Visit: Payer: Self-pay | Admitting: Family Medicine

## 2018-07-17 DIAGNOSIS — Z0289 Encounter for other administrative examinations: Secondary | ICD-10-CM

## 2018-08-28 ENCOUNTER — Other Ambulatory Visit: Payer: Self-pay | Admitting: Family Medicine

## 2018-08-28 DIAGNOSIS — F419 Anxiety disorder, unspecified: Secondary | ICD-10-CM

## 2018-08-29 NOTE — Telephone Encounter (Signed)
Last OV 04/14/2018   Last refilled 06/10/2018 disp 60 with 1 refill   Sent to PCP for approval

## 2018-08-31 NOTE — Telephone Encounter (Signed)
Sent to pharmacy.  Controlled substance database reviewed.  Patient needs an office visit for future refills.  Please contact him to get him set up for a visit.  Thanks.

## 2018-09-18 ENCOUNTER — Other Ambulatory Visit: Payer: Self-pay | Admitting: Family Medicine

## 2018-10-08 ENCOUNTER — Other Ambulatory Visit: Payer: Self-pay | Admitting: Family Medicine

## 2018-10-10 ENCOUNTER — Other Ambulatory Visit: Payer: Self-pay | Admitting: Family Medicine

## 2018-10-10 DIAGNOSIS — F419 Anxiety disorder, unspecified: Secondary | ICD-10-CM

## 2018-10-10 NOTE — Telephone Encounter (Signed)
Short-term refill sent to pharmacy.  Patient needs to schedule a follow-up office visit to receive further refills.  Please call the patient and get him scheduled.  Thanks.

## 2018-10-10 NOTE — Telephone Encounter (Signed)
Last OV 04/14/2018   Last Refilled 08/31/2018 disp 60 with no refills   Sent to PCP for approval

## 2018-11-01 ENCOUNTER — Other Ambulatory Visit: Payer: Self-pay | Admitting: Family Medicine

## 2018-11-01 DIAGNOSIS — F419 Anxiety disorder, unspecified: Secondary | ICD-10-CM

## 2018-11-02 NOTE — Telephone Encounter (Signed)
Please call the patient.  Please advise him he needs to follow-up in the office for future refills.  I sent a tapering prescription in for him to taper off of this medication as he has not followed up appropriately in the office.  If he does not follow-up I will not refill this medication.

## 2018-11-02 NOTE — Telephone Encounter (Signed)
LMTCB to make appointment for further medication refills.

## 2018-12-20 ENCOUNTER — Other Ambulatory Visit: Payer: Self-pay | Admitting: Family Medicine

## 2018-12-20 DIAGNOSIS — F419 Anxiety disorder, unspecified: Secondary | ICD-10-CM

## 2018-12-21 NOTE — Telephone Encounter (Signed)
Refilled: 12/12/20219 Last OV: 04/14/2018 Next OV: 12/25/2018

## 2018-12-22 MED ORDER — CLONAZEPAM 0.5 MG PO TABS: 0.5000 mg | ORAL_TABLET | Freq: Two times a day (BID) | ORAL | 0 refills | Status: DC | PRN

## 2018-12-22 NOTE — Telephone Encounter (Signed)
Short term refill sent to pharmacy

## 2018-12-25 ENCOUNTER — Encounter: Payer: Self-pay | Admitting: Family Medicine

## 2018-12-25 ENCOUNTER — Ambulatory Visit: Payer: 59 | Admitting: Family Medicine

## 2018-12-25 VITALS — BP 138/88 | HR 83 | Temp 97.8°F | Ht 72.0 in | Wt 263.0 lb

## 2018-12-25 DIAGNOSIS — Z6835 Body mass index (BMI) 35.0-35.9, adult: Secondary | ICD-10-CM

## 2018-12-25 DIAGNOSIS — E890 Postprocedural hypothyroidism: Secondary | ICD-10-CM

## 2018-12-25 DIAGNOSIS — I10 Essential (primary) hypertension: Secondary | ICD-10-CM | POA: Diagnosis not present

## 2018-12-25 DIAGNOSIS — R5383 Other fatigue: Secondary | ICD-10-CM

## 2018-12-25 DIAGNOSIS — G4710 Hypersomnia, unspecified: Secondary | ICD-10-CM | POA: Diagnosis not present

## 2018-12-25 DIAGNOSIS — E669 Obesity, unspecified: Secondary | ICD-10-CM | POA: Insufficient documentation

## 2018-12-25 DIAGNOSIS — F419 Anxiety disorder, unspecified: Secondary | ICD-10-CM

## 2018-12-25 DIAGNOSIS — F32A Depression, unspecified: Secondary | ICD-10-CM

## 2018-12-25 DIAGNOSIS — F329 Major depressive disorder, single episode, unspecified: Secondary | ICD-10-CM

## 2018-12-25 MED ORDER — CLONAZEPAM 0.5 MG PO TABS: 0.5000 mg | ORAL_TABLET | Freq: Two times a day (BID) | ORAL | 0 refills | Status: DC | PRN

## 2018-12-25 MED ORDER — SERTRALINE HCL 100 MG PO TABS: 200.0000 mg | ORAL_TABLET | Freq: Every day | ORAL | 1 refills | Status: DC

## 2018-12-25 NOTE — Assessment & Plan Note (Signed)
Concern for sleep apnea.  We will refer to pulmonology for further evaluation.  We will additionally evaluate for hypogonadism and B12 deficiency as causes of his symptoms.  He will return for these labs.

## 2018-12-25 NOTE — Patient Instructions (Addendum)
Nice to see you. We will have you return for lab work.   We will increase your Zoloft. We will refer you to pulmonology for your tiredness for evaluation for sleep apnea.

## 2018-12-25 NOTE — Assessment & Plan Note (Signed)
Borderline blood pressures.  Discussed checking daily at home and then returning for nurse BP check in 1 week.  He will continue amlodipine.

## 2018-12-25 NOTE — Assessment & Plan Note (Signed)
Discussed and encouraged dietary changes and exercise.

## 2018-12-25 NOTE — Assessment & Plan Note (Signed)
He continues to follow with endocrinology.  He had radioactive iodine ablation.

## 2018-12-25 NOTE — Assessment & Plan Note (Signed)
Continues to have some anxiety.  We will increase his Zoloft to 200 mg daily.  Continue clonazepam.  Discussed the need to follow-up at least every 3 to 6 months while on this medication.

## 2018-12-25 NOTE — Progress Notes (Signed)
[name], MD Phone: [phone]  Edward Hurley is a 43 y.o. male who presents today for follow-up.  CC: Anxiety/depression, hypertension, hypothyroidism, hypersomnia  Anxiety/depression: Patient denies depression.  He does note his anxiety is improved.  He does have his moments though it is not awful.  He takes clonazepam 1-2 times a day.  He is on Zoloft 150 mg daily.  No drowsiness or alcohol use with the clonazepam.  He did not go to therapy.  Hypertension: Not checking blood pressures.  Taking amlodipine.  No chest pain, shortness of breath, or edema.  Obesity: Patient has not been exercising recently.  Has had a lack of time with his schedule.  He has been eating on the run.  He is working towards starting to exercise in the next week or so with walking on a treadmill.  He is going to do dietary changes that are like weight watchers.  Hypersomnia: Patient reports for some time now felt tired and is easy to fall asleep.  He does not have the get up and go that he used to have.  He does snore.  He notes his libido is down.  He notes no apnea.  He does not wake up well rested.  He wonders about testosterone testing.  Social History   Tobacco Use  Smoking Status Light Tobacco Smoker  Smokeless Tobacco Never Used  Tobacco Comment   1 per day, on weekend 4-5 cig     ROS see history of present illness  Objective  Physical Exam Vitals:   12/25/18 1611  BP: 138/88  Pulse: 83  Temp: 97.8 F (36.6 C)  SpO2: 97%    BP Readings from Last 3 Encounters:  12/25/18 138/88  05/04/18 138/90  04/20/18 124/80   Wt Readings from Last 3 Encounters:  12/25/18 263 lb (119.3 kg)  04/14/18 239 lb (108.4 kg)  06/29/17 239 lb 6 oz (108.6 kg)    Physical Exam Constitutional:      General: He is not in acute distress.    Appearance: He is not diaphoretic.  Cardiovascular:     Rate and Rhythm: Normal rate and regular rhythm.     Heart sounds: Normal heart sounds.    Pulmonary:     Effort: Pulmonary effort is normal.     Breath sounds: Normal breath sounds.  Musculoskeletal:     Right lower leg: No edema.     Left lower leg: No edema.  Skin:    General: Skin is warm and dry.  Neurological:     Mental Status: He is alert.      Assessment/Plan: Please see individual problem list.  Essential hypertension, benign Borderline blood pressures.  Discussed checking daily at home and then returning for nurse BP check in 1 week.  He will continue amlodipine.  Hypothyroidism He continues to follow with endocrinology.  He had radioactive iodine ablation.  Anxiety and depression Continues to have some anxiety.  We will increase his Zoloft to 200 mg daily.  Continue clonazepam.  Discussed the need to follow-up at least every 3 to 6 months while on this medication.  Hypersomnia Concern for sleep apnea.  We will refer to pulmonology for further evaluation.  We will additionally evaluate for hypogonadism and B12 deficiency as causes of his symptoms.  He will return for these labs.  Obesity Discussed and encouraged dietary changes and exercise.   Orders Placed This Encounter  Procedures  . Testosterone    Standing Status:   Future  Standing Expiration Date:   12/26/2019  . B12    Standing Status:   Future    Standing Expiration Date:   12/26/2019  . Comp Met (CMET)    Standing Status:   Future    Standing Expiration Date:   12/26/2019  . Ambulatory referral to Pulmonology    Referral Priority:   Routine    Referral Type:   Consultation    Referral Reason:   Specialty Services Required    Requested Specialty:   Pulmonary Disease    Number of Visits Requested:   1    Meds ordered this encounter  Medications  . sertraline (ZOLOFT) 100 MG tablet    Sig: Take 2 tablets (200 mg total) by mouth daily.    Dispense:  180 tablet    Refill:  1  . clonazePAM (KLONOPIN) 0.5 MG tablet    Sig: Take 1 tablet (0.5 mg total) by mouth 2 (two) times daily as  needed for anxiety.    Dispense:  60 tablet    Refill:  0     [name], MD [place]

## 2019-01-02 ENCOUNTER — Encounter: Payer: Self-pay | Admitting: Family Medicine

## 2019-01-03 ENCOUNTER — Other Ambulatory Visit: Payer: Self-pay

## 2019-01-03 MED ORDER — AMLODIPINE BESYLATE 5 MG PO TABS: 5.0000 mg | ORAL_TABLET | Freq: Every day | ORAL | 2 refills | Status: DC

## 2019-01-04 ENCOUNTER — Ambulatory Visit: Payer: Self-pay

## 2019-01-04 ENCOUNTER — Other Ambulatory Visit: Payer: Self-pay

## 2019-01-25 ENCOUNTER — Other Ambulatory Visit: Payer: Self-pay | Admitting: Family Medicine

## 2019-01-25 ENCOUNTER — Ambulatory Visit: Payer: Self-pay | Admitting: *Deleted

## 2019-01-25 DIAGNOSIS — F419 Anxiety disorder, unspecified: Secondary | ICD-10-CM

## 2019-01-25 DIAGNOSIS — J111 Influenza due to unidentified influenza virus with other respiratory manifestations: Secondary | ICD-10-CM

## 2019-01-25 MED ORDER — OSELTAMIVIR PHOSPHATE 75 MG PO CAPS
75.0000 mg | ORAL_CAPSULE | Freq: Two times a day (BID) | ORAL | 0 refills | Status: DC
Start: 2019-01-25 — End: 2019-01-26

## 2019-01-25 NOTE — Telephone Encounter (Signed)
Patient notified and voiced understanding.will be evaluated if symptoms worsen.

## 2019-01-25 NOTE — Addendum Note (Signed)
Addended by: Leanora Cover on: 01/25/2019 04:25 PM   Modules accepted: Orders

## 2019-01-25 NOTE — Telephone Encounter (Signed)
Tamiflu sent to pharmacy.  Advised patient to rest, do good handwashing keep up good fluid intake and eat a bland diet.  Also advised to alternate Tylenol and Advil as needed to reduce any pain or fever.  Monitor cell for any worsening symptoms including very high fever, shortness of breath, chest pain, severe abdominal pain and call right away if any of these things occur

## 2019-01-25 NOTE — Telephone Encounter (Signed)
Pt called and stated that wife works in a daycare center and lots of kids are testing positive for the flu; he says that his wife was started on tamiflu on 01/24/2019 and she has symptoms; Pt was advised to get tamiflu by center; Pt also states that he has an autoimmune disorder; he says that he has a cough, and sore throat; the pt states that he did receive a flu shot this year; the pt verifies that [facility] [address] [place]; nurse triage initiated and recommendations made per protocol;  Pt last seen by Dr Birdie Sons. [facility] , 12/25/2018;will route to office for final disposition.  Reason for Disposition . [1] Influenza EXPOSURE (Close Contact) within last 48 hours (2 days) AND [2] exposed person is HIGH RISK (e.g., age > 64 years, pregnant, HIV+, chronic medical condition)  Answer Assessment - Initial Assessment Questions 1. TYPE of EXPOSURE: "How were you exposed?" (e.g., close contact, not a close contact)     Pt's wife works at day care with numerous cases of flu; she has gotten tamiflu 01/24/2019 2. DATE of EXPOSURE: "When did the exposure occur?" (e.g., hour, days, weeks)     days 3. PREGNANCY: "Is there any chance you are pregnant?" "When was your last menstrual period?"     n/a 4. HIGH RISK for COMPLICATIONS: "Do you have any heart or lung problems? Do you have a weakened immune system?" (e.g., CHF, COPD, asthma, HIV positive, chemotherapy, renal failure, diabetes mellitus, sickle cell anemia)     Autoimmune disease 5. SYMPTOMS: "Do you have any symptoms?" (e.g., cough, fever, sore throat, difficulty breathing).     Cough and sore throat  Protocols used: INFLUENZA EXPOSURE-A-AH

## 2019-01-25 NOTE — Telephone Encounter (Signed)
Last OV 12/25/2018  Last refilled 12/25/2018 disp 60 with no refills   Pt is ok with waiting until Monday for refill.

## 2019-01-25 NOTE — Telephone Encounter (Signed)
Asking for Tamiflu.

## 2019-01-26 ENCOUNTER — Other Ambulatory Visit: Payer: Self-pay | Admitting: Family Medicine

## 2019-01-26 DIAGNOSIS — J111 Influenza due to unidentified influenza virus with other respiratory manifestations: Secondary | ICD-10-CM

## 2019-01-26 MED ORDER — OSELTAMIVIR PHOSPHATE 75 MG PO CAPS: 75.0000 mg | ORAL_CAPSULE | Freq: Two times a day (BID) | ORAL | 0 refills | Status: DC

## 2019-01-26 NOTE — Addendum Note (Signed)
Addended by: Dennie Bible on: 01/26/2019 12:52 PM   Modules accepted: Orders

## 2019-01-26 NOTE — Telephone Encounter (Signed)
Copied from CRM [id number]. Topic: Quick Communication - Rx Refill/Question >> Jan 26, 2019 12:42 PM Edward Hurley wrote: Medication: oseltamivir (TAMIFLU) 75 MG capsule [[id number]]  Has the patient contacted their pharmacy? Yes.   (Agent: If no, request that the patient contact the pharmacy for the refill.) (Agent: If yes, when and what did the pharmacy advise?) The pharmacy did not have receive the request for the medication. Patient is asking if it can be sent again  Preferred Pharmacy (with phone number or street name): [id number] [place] [place] - [address] [phone] (Phone) [phone] (Fax)  Agent: Please be advised that RX refills may take up to 3 business days. We ask that you follow-up with your pharmacy.

## 2019-01-26 NOTE — Telephone Encounter (Signed)
Resent to pharmacy original script marked as phonein.

## 2019-01-31 ENCOUNTER — Other Ambulatory Visit: Payer: Self-pay

## 2019-01-31 ENCOUNTER — Ambulatory Visit: Payer: Self-pay

## 2019-04-02 ENCOUNTER — Ambulatory Visit: Payer: Self-pay | Admitting: Family Medicine

## 2019-06-07 ENCOUNTER — Ambulatory Visit (INDEPENDENT_AMBULATORY_CARE_PROVIDER_SITE_OTHER): Payer: 59 | Admitting: Internal Medicine

## 2019-06-07 ENCOUNTER — Encounter: Payer: Self-pay | Admitting: Internal Medicine

## 2019-06-07 ENCOUNTER — Other Ambulatory Visit: Payer: Self-pay

## 2019-06-07 VITALS — BP 124/80 | HR 88 | Temp 98.4°F | Ht 72.0 in | Wt 271.8 lb

## 2019-06-07 DIAGNOSIS — G4719 Other hypersomnia: Secondary | ICD-10-CM

## 2019-06-07 NOTE — Patient Instructions (Signed)

## 2019-06-07 NOTE — Progress Notes (Signed)
[facility] Consultation      Assessment and Plan:  Excessive daytime sleepiness. - Symptoms and signs of obstructive sleep apnea. - We will send for sleep study.  Essential hypertension. - Sleep apnea can contribute to above condition, therefore treatment of sleep apnea is an important part of management.  Orders Placed This Encounter  Procedures  . Home sleep test   Return in about 3 months (around 09/07/2019).    Date: 06/07/2019  MRN# [id number] Edward Hurley November 13, 1976   Edward Hurley is a 43 y.o. old male seen in consultation for chief complaint of:    Chief Complaint  Patient presents with  . sleep consult    per Dr. Corine ShelterSonnernberg- no prior sleep study. c/o daytime sleepiness, loud snoring, gasping for air during sleep & restless sleep.     HPI:  Edward PiedraMichael  Hurley is a 43 y.o. old male referred for symptoms of excessive daytime sleepiness.  He typically goes to bed between 12 and 3 AM.  Falls asleep quickly, gets out of bed at 9:45 AM.  His Epworth score is elevated at 13.  He has gained about 50 pounds in the last 2 years.  He has been having severe lethargy, which has gotten worse as he has gained weight over the past year as his weight has increased.  He can fall asleep very easily when sitting idle.  Denies sleep paralysis, cataplexy, sleep walking. Denies TMJ, jaw pain, no dentures.  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     PMHX:   Past Medical History:  Diagnosis Date  . Allergy   . Anxiety   . Graves disease 05/07/2017  . Headache   . Heart murmur   . Hypertension   . Hyperthyroidism   . Irritability    Surgical Hx:  -- Family Hx:  Family History  Problem Relation Age of Onset  . Thyroid disease Neg Hx   . Hyperlipidemia Father   . Alcoholism Maternal Uncle   . Prostate cancer Paternal Uncle   . Hypertension Father   . Mental illness Maternal Uncle    Social  Hx:   Social History   Tobacco Use  . Smoking status: Light Tobacco Smoker  . Smokeless tobacco: Never Used  . Tobacco comment: 1 per day, on weekend 4-5 cig  Substance Use Topics  . Alcohol use: Yes    Alcohol/week: 0.0 standard drinks  . Drug use: No   Medication:    Current Outpatient Medications:  .  amLODipine (NORVASC) 5 MG tablet, Take 1 tablet (5 mg total) by mouth daily., Disp: 90 tablet, Rfl: 2 .  clonazePAM (KLONOPIN) 0.5 MG tablet, TAKE 1 TABLET (0.5 MG TOTAL) BY MOUTH 2 (TWO) TIMES DAILY AS NEEDED FOR ANXIETY., Disp: 60 tablet, Rfl: 0 .  levothyroxine (SYNTHROID, LEVOTHROID) 112 MCG tablet, Take 224 mcg by mouth daily., Disp: , Rfl:  .  Multiple Vitamin (MULTI-VITAMINS) TABS, Take by mouth., Disp: , Rfl:  .  sertraline (ZOLOFT) 100 MG tablet, Take 2 tablets (200 mg total) by mouth daily., Disp: 180 tablet, Rfl: 1   Allergies:  Patient has no known allergies.  Review of Systems: Gen:  Denies  fever, sweats, chills HEENT: Denies blurred vision, double vision. bleeds, sore throat Cvc:  No dizziness, chest pain. Resp:   Denies cough or sputum production, shortness of breath Gi: Denies swallowing difficulty, stomach pain. Gu:  Denies bladder incontinence, burning urine Ext:  No Joint pain, stiffness. Skin: No skin rash,  hives  Endoc:  No polyuria, polydipsia. Psych: No depression, insomnia. Other:  All other systems were reviewed with the patient and were negative other that what is mentioned in the HPI.   Physical Examination:   VS: BP 124/80 (BP Location: Left Arm, Cuff Size: Normal)   Pulse 88   Temp 98.4 F (36.9 C) (Temporal)   Ht 6' (1.829 m)   Wt 271 lb 12.8 oz (123.3 kg)   SpO2 95%   BMI 36.86 kg/m   General Appearance: No distress  Neuro:without focal findings,  speech normal,  HEENT: PERRLA, EOM intact.  Mallampati 3, enlarged uvula. Pulmonary: normal breath sounds, No wheezing.  CardiovascularNormal S1,S2.  No m/r/g.   Abdomen: Benign, Soft,  non-tender. Renal:  No costovertebral tenderness  GU:  No performed at this time. Endoc: No evident thyromegaly, no signs of acromegaly. Skin:   warm, no rashes, no ecchymosis  Extremities: normal, no cyanosis, clubbing.  Other findings:    LABORATORY PANEL:   CBC No results for input(s): WBC, HGB, HCT, PLT in the last 168 hours. ------------------------------------------------------------------------------------------------------------------  Chemistries  No results for input(s): NA, K, CL, CO2, GLUCOSE, BUN, CREATININE, CALCIUM, MG, AST, ALT, ALKPHOS, BILITOT in the last 168 hours.  Invalid input(s): GFRCGP ------------------------------------------------------------------------------------------------------------------  Cardiac Enzymes No results for input(s): TROPONINI in the last 168 hours. ------------------------------------------------------------  RADIOLOGY:  No results found.     Thank  you for the consultation and for allowing [facility] to assist in the care of your patient. Our recommendations are noted above.  Please contact us if we can be of further service.   Marda Stalker, M.D., F.C.C.P.  Board Certified in Internal Medicine, Pulmonary Medicine, [place], and Sleep Medicine.  [facility] Office Number: [phone]   06/07/2019

## 2019-06-22 ENCOUNTER — Ambulatory Visit: Payer: 59

## 2019-06-22 DIAGNOSIS — G4719 Other hypersomnia: Secondary | ICD-10-CM

## 2019-06-27 DIAGNOSIS — G4733 Obstructive sleep apnea (adult) (pediatric): Secondary | ICD-10-CM

## 2019-07-02 ENCOUNTER — Telehealth: Payer: Self-pay

## 2019-07-02 DIAGNOSIS — G4733 Obstructive sleep apnea (adult) (pediatric): Secondary | ICD-10-CM

## 2019-07-02 NOTE — Telephone Encounter (Signed)
LMOM for pt to call to discuss results. Sleep study showed severe OSA and it is recommended that he start auto cpap with pressure range of 5-20 cm H2O. Will wait for return call.

## 2019-07-02 NOTE — Telephone Encounter (Signed)
Spoke with pt. Pt advised he has severe OSA and it is recommended that he start auto cpap. Pt wishes to proceed order placed. Pt stated he will call for his f/u appt. and understands that appt. needs to be within 31-90 days of starting cpap.

## 2019-07-14 ENCOUNTER — Other Ambulatory Visit: Payer: Self-pay | Admitting: Family Medicine

## 2019-07-16 NOTE — Telephone Encounter (Signed)
I will send in a short term refill. Patient needs to be scheduled for follow-up. Thanks.

## 2019-07-30 ENCOUNTER — Other Ambulatory Visit: Payer: Self-pay | Admitting: Family Medicine

## 2019-08-02 NOTE — Telephone Encounter (Signed)
Please call the patient to get him scheduled for follow-up.  1 refill sent to pharmacy.  He will need to follow-up to receive further refills.

## 2019-11-03 ENCOUNTER — Other Ambulatory Visit: Payer: Self-pay | Admitting: Family Medicine

## 2019-12-12 ENCOUNTER — Other Ambulatory Visit: Payer: Self-pay | Admitting: Family Medicine

## 2020-01-04 ENCOUNTER — Other Ambulatory Visit: Payer: Self-pay | Admitting: Family Medicine

## 2020-01-27 ENCOUNTER — Other Ambulatory Visit: Payer: Self-pay | Admitting: Family Medicine

## 2020-01-29 ENCOUNTER — Other Ambulatory Visit: Payer: Self-pay | Admitting: Family Medicine

## 2020-03-06 ENCOUNTER — Other Ambulatory Visit: Payer: Self-pay

## 2020-03-06 ENCOUNTER — Encounter: Payer: Self-pay | Admitting: Nurse Practitioner

## 2020-03-06 ENCOUNTER — Ambulatory Visit: Payer: 59 | Admitting: Nurse Practitioner

## 2020-03-06 VITALS — BP 148/98 | HR 82 | Temp 96.3°F | Ht 72.0 in | Wt 271.1 lb

## 2020-03-06 DIAGNOSIS — R1031 Right lower quadrant pain: Secondary | ICD-10-CM

## 2020-03-06 DIAGNOSIS — Z8371 Family history of colonic polyps: Secondary | ICD-10-CM

## 2020-03-06 DIAGNOSIS — R1903 Right lower quadrant abdominal swelling, mass and lump: Secondary | ICD-10-CM | POA: Diagnosis not present

## 2020-03-06 DIAGNOSIS — F329 Major depressive disorder, single episode, unspecified: Secondary | ICD-10-CM

## 2020-03-06 DIAGNOSIS — K625 Hemorrhage of anus and rectum: Secondary | ICD-10-CM

## 2020-03-06 DIAGNOSIS — F419 Anxiety disorder, unspecified: Secondary | ICD-10-CM

## 2020-03-06 DIAGNOSIS — Z6836 Body mass index (BMI) 36.0-36.9, adult: Secondary | ICD-10-CM | POA: Diagnosis not present

## 2020-03-06 DIAGNOSIS — F32A Depression, unspecified: Secondary | ICD-10-CM

## 2020-03-06 LAB — CBC WITH DIFFERENTIAL/PLATELET
Basophils Absolute: 0.1 10*3/uL (ref 0.0–0.1)
Basophils Relative: 0.6 % (ref 0.0–3.0)
Eosinophils Absolute: 0.2 10*3/uL (ref 0.0–0.7)
Eosinophils Relative: 2.2 % (ref 0.0–5.0)
HCT: 40.8 % (ref 39.0–52.0)
Hemoglobin: 14.5 g/dL (ref 13.0–17.0)
Lymphocytes Relative: 23 % (ref 12.0–46.0)
Lymphs Abs: 2 10*3/uL (ref 0.7–4.0)
MCHC: 35.5 g/dL (ref 30.0–36.0)
MCV: 94.4 fl (ref 78.0–100.0)
Monocytes Absolute: 0.6 10*3/uL (ref 0.1–1.0)
Monocytes Relative: 6.9 % (ref 3.0–12.0)
Neutro Abs: 5.7 10*3/uL (ref 1.4–7.7)
Neutrophils Relative %: 67.3 % (ref 43.0–77.0)
Platelets: 230 10*3/uL (ref 150.0–400.0)
RBC: 4.32 Mil/uL (ref 4.22–5.81)
RDW: 13.3 % (ref 11.5–15.5)
WBC: 8.6 10*3/uL (ref 4.0–10.5)

## 2020-03-06 LAB — LIPID PANEL
Cholesterol: 232 mg/dL — ABNORMAL HIGH (ref 0–200)
HDL: 41.4 mg/dL (ref >39.00)
LDL Cholesterol: 159 mg/dL — ABNORMAL HIGH (ref 0–99)
NonHDL: 191.06
Total CHOL/HDL Ratio: 6
Triglycerides: 160 mg/dL — ABNORMAL HIGH (ref 0.0–149.0)
VLDL: 32 mg/dL (ref 0.0–40.0)

## 2020-03-06 LAB — COMPREHENSIVE METABOLIC PANEL
ALT: 40 U/L (ref 0–53)
AST: 25 U/L (ref 0–37)
Albumin: 4.8 g/dL (ref 3.5–5.2)
Alkaline Phosphatase: 59 U/L (ref 39–117)
BUN: 15 mg/dL (ref 6–23)
CO2: 29 mEq/L (ref 19–32)
Calcium: 9.4 mg/dL (ref 8.4–10.5)
Chloride: 103 mEq/L (ref 96–112)
Creatinine, Ser: 1 mg/dL (ref 0.40–1.50)
GFR: 81.83 mL/min (ref >60.00)
Glucose, Bld: 133 mg/dL — ABNORMAL HIGH (ref 70–99)
Potassium: 4.1 mEq/L (ref 3.5–5.1)
Sodium: 140 mEq/L (ref 135–145)
Total Bilirubin: 0.4 mg/dL (ref 0.2–1.2)
Total Protein: 7.3 g/dL (ref 6.0–8.3)

## 2020-03-06 LAB — TSH: TSH: 23.37 u[IU]/mL — ABNORMAL HIGH (ref 0.35–4.50)

## 2020-03-06 LAB — HEMOGLOBIN A1C: Hgb A1c MFr Bld: 7 % — ABNORMAL HIGH (ref 4.6–6.5)

## 2020-03-06 NOTE — Telephone Encounter (Signed)
He is scheduled 5/3.

## 2020-03-06 NOTE — Telephone Encounter (Signed)
I have not seen him in over a year. He needs to follow-up with me to receive a refill. Please get him scheduled for follow-up.

## 2020-03-06 NOTE — Patient Instructions (Addendum)
It was nice to meet you today.  Please go to the lab after this visit.     I have ordered a CT of the abdomen and pelvis to evaluate your right lower quadrant bulge and mild tenderness.  I have ordered a referral to gastroenterology to evaluate your rectal bleeding and family history of colon polyps.  I suspect hernia  in the abdomen, and would advise you not to do any heavy lifting, bending or activity until we see what the CT shows.    If you develop severe abdominal pain, nausea vomiting, fevers or chills, change in bowel habits, or significant rectal bleeding please proceed to the emergency room.  Weight loss is recommended.  Rectal Bleeding  Rectal bleeding is when blood passes out of the anus. People with rectal bleeding may notice bright red blood in their underwear or in the toilet after having a bowel movement. They may also have dark red or black stools. Rectal bleeding is usually a sign that something is wrong. Many things can cause rectal bleeding, including:  Hemorrhoids. These are blood vessels in the anus or rectum that are larger than normal.  Fistulas. These are abnormal passages in the rectum and anus.  Anal fissures. This is a tear in the anus.  Diverticulosis. This is a condition in which pockets or sacs project from the bowel.  Proctitis and colitis. These are conditions in which the rectum, colon, or anus become inflamed.  Polyps. These are growths that can be cancerous (malignant) or non-cancerous (benign).  Part of the rectum sticking out from the anus (rectal prolapse).  Certain medicines.  Intestinal infections. Follow these instructions at home: Pay attention to any changes in your symptoms. Take these actions to help lessen bleeding and discomfort:  Eat a diet that is high in fiber. This will keep your stool soft, making it easier to pass stools without straining. Ask your health care provider what foods and drinks are high in fiber.  Drink enough  fluid to keep your urine clear or pale yellow. This also helps to keep your stool soft.  Try taking a warm bath. This may help soothe any pain in your rectum.  Keep all follow-up visits as told by your health care provider. This is important. Get help right away if:  You have new or increased rectal bleeding.  You have black or dark red stools.  You vomit blood or something that looks like coffee grounds.  You have pain or tenderness in your abdomen.  You have a fever.  You feel weak.  You feel nauseous.  You faint.  You have severe pain in your rectum.  You cannot have a bowel movement. This information is not intended to replace advice given to you by your health care provider. Make sure you discuss any questions you have with your health care provider. Document Revised: 07/01/2016 Document Reviewed: 01/04/2016 Elsevier Patient Education  2020 [employer]  Obesity, Adult Obesity is the condition of having too much total body fat. Being overweight or obese means that your weight is greater than what is considered healthy for your body size. Obesity is determined by a measurement called BMI. BMI is an estimate of body fat and is calculated from height and weight. For adults, a BMI of 30 or higher is considered obese. Obesity can lead to other health concerns and major illnesses, including:  Stroke.  Coronary artery disease (CAD).  Type 2 diabetes.  Some types of cancer, including cancers of the  colon, breast, uterus, and gallbladder.  Osteoarthritis.  High blood pressure (hypertension).  High cholesterol.  Sleep apnea.  Gallbladder stones.  Infertility problems. What are the causes? Common causes of this condition include:  Eating daily meals that are high in calories, sugar, and fat.  Being born with genes that may make you more likely to become obese.  Having a medical condition that causes obesity, including: ? Hypothyroidism. ? Polycystic ovarian  syndrome (PCOS). ? Binge-eating disorder. ? Cushing syndrome.  Taking certain medicines, such as steroids, antidepressants, and seizure medicines.  Not being physically active (sedentary lifestyle).  Not getting enough sleep.  Drinking high amounts of sugar-sweetened beverages, such as soft drinks. What increases the risk? The following factors may make you more likely to develop this condition:  Having a family history of obesity.  Being a woman of African American descent.  Being a man of Hispanic descent.  Living in an area with limited access to: ? [place] [facility], recreation centers, or sidewalks. ? Healthy food choices, such as grocery stores and farmers' markets. What are the signs or symptoms? The main sign of this condition is having too much body fat. How is this diagnosed? This condition is diagnosed based on:  Your BMI. If you are an adult with a BMI of 30 or higher, you are considered obese.  Your waist circumference. This measures the distance around your waistline.  Your skinfold thickness. Your health care provider may gently pinch a fold of your skin and measure it. You may have other tests to check for underlying conditions. How is this treated? Treatment for this condition often includes changing your lifestyle. Treatment may include some or all of the following:  Dietary changes. This may include developing a healthy meal plan.  Regular physical activity. This may include activity that causes your heart to beat faster (aerobic exercise) and strength training. Work with your health care provider to design an exercise program that works for you.  Medicine to help you lose weight if you are unable to lose 1 pound a week after 6 weeks of healthy eating and more physical activity.  Treating conditions that cause the obesity (underlying conditions).  Surgery. Surgical options may include gastric banding and gastric bypass. Surgery may be done if: ? Other treatments  have not helped to improve your condition. ? You have a BMI of 40 or higher. ? You have life-threatening health problems related to obesity. Follow these instructions at home: Eating and drinking   Follow recommendations from your health care provider about what you eat and drink. Your health care provider may advise you to: ? Limit fast food, sweets, and processed snack foods. ? Choose low-fat options, such as low-fat milk instead of whole milk. ? Eat 5 or more servings of fruits or vegetables every day. ? Eat at home more often. This gives you more control over what you eat. ? Choose healthy foods when you eat out. ? Learn to read food labels. This will help you understand how much food is considered 1 serving. ? Learn what a healthy serving size is. ? Keep low-fat snacks available. ? Limit sugary drinks, such as soda, fruit juice, sweetened iced tea, and flavored milk.  Drink enough water to keep your urine pale yellow.  Do not follow a fad diet. Fad diets can be unhealthy and even dangerous. Physical activity  Exercise regularly, as told by your health care provider. ? Most adults should get up to 150 minutes of moderate-intensity exercise every  week. ? Ask your health care provider what types of exercise are safe for you and how often you should exercise.  Warm up and stretch before being active.  Cool down and stretch after being active.  Rest between periods of activity. Lifestyle  Work with your health care provider and a dietitian to set a weight-loss goal that is healthy and reasonable for you.  Limit your screen time.  Find ways to reward yourself that do not involve food.  Do not drink alcohol if: ? Your health care provider tells you not to drink. ? You are pregnant, may be pregnant, or are planning to become pregnant.  If you drink alcohol: ? Limit how much you use to:  0-1 drink a day for women.  0-2 drinks a day for men. ? Be aware of how much alcohol  is in your drink. In the [place] one drink equals one 12 oz bottle of beer (355 mL), one 5 oz glass of wine (148 mL), or one 1 oz glass of hard liquor (44 mL). General instructions  Keep a weight-loss journal to keep track of the food you eat and how much exercise you get.  Take over-the-counter and prescription medicines only as told by your health care provider.  Take vitamins and supplements only as told by your health care provider.  Consider joining a support group. Your health care provider may be able to recommend a support group.  Keep all follow-up visits as told by your health care provider. This is important. Contact a health care provider if:  You are unable to meet your weight loss goal after 6 weeks of dietary and lifestyle changes. Get help right away if you are having:  Trouble breathing.  Suicidal thoughts or behaviors. Summary  Obesity is the condition of having too much total body fat.  Being overweight or obese means that your weight is greater than what is considered healthy for your body size.  Work with your health care provider and a dietitian to set a weight-loss goal that is healthy and reasonable for you.  Exercise regularly, as told by your health care provider. Ask your health care provider what types of exercise are safe for you and how often you should exercise. This information is not intended to replace advice given to you by your health care provider. Make sure you discuss any questions you have with your health care provider. Document Revised: 07/13/2018 Document Reviewed: 07/13/2018 Elsevier Patient Education  2020 [employer].

## 2020-03-06 NOTE — Progress Notes (Signed)
Established Patient Office Visit  Subjective:  Patient ID: Edward Hurley, male    DOB: 1976/05/11  Age: 44 y.o. MRN: [id number]  CC:  Chief Complaint  Patient presents with  . Acute Visit    possible hernia    HPI Edward Hurley presents for concerns about hernia and bulging right lower abdomen and softness and tenderness in umbilical area. He noted the belly button softness 2-3 weeks ago. The bulging noted in last 1 week. More prominent with sitting and is a little tender not painful. Not associated with straining or lifting.  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 He cut his work hours back out of concern about hernia. No hx of abdominal surgery.   He has less consistent BM and in the past 6 mos he has seen intermittent bright blood in the water and on the tissue. A few days he felt like he needed a BM and passed only blood. This would then be followed by normal stools. He saw blood consistent and then it would resolve.He thinks he had a little hemorrhoid, but no specific itching, burning or pain. It has been over a week since he last saw a small amount blood with a BM. FH; negative CC. Father- colon polyps in his 13s.  Obesity: BMI 36.77. Positive wt gain of  60 lbs over 1.5 years not going to the gym during the Covid pandemic.   Anxiety/depression: GAD-7: 12 and is taking sertraline 200 mg has not had any Klonopin. He says he is at his baseline. 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  He also feels stress when he has to perform in theatre plays, but enjoys if. No desire to have behavioral therapy.Depression: PHQ-2: 16. None at all SI. Sleeping with C-PAP- helpful, but does not maintain a good sleep schedule. Weekend beer x 4. Smoker- x 2 cigarettes a day.   HTN: BP 148/98 on Norvasc 5 mg daily and tolerating that medication well.  He denies any edema, chest pain, shortness of breath,  dizziness, lightheadedness. BP Readings from Last 3 Encounters:  03/06/20 (!) 148/98  06/07/19 124/80  12/25/18 138/88   Hypothyroidism: He has Graves' disease and is followed by Dr. Gabriel Carina in Endocrinology. He had a I-123 uptake/scan followed by I-131 ablation (16.39 mCi mCi given) on 07/22/17 and 07/26/17, respectively. He developed post-ablative hypothyroidism and was started on levothyroxine in 10/2017. He is now taking levothyroxine 224 mcg (two of the 112 mcg tabs) six days per week and 336 mcg (three of the 112 mcg tabs) on Sundays. Takes levothyroxine daily, fasting. He reports significant weight gain, fatigue, and most recent TSH 5.129 on 01/23/2020.  Pre-diabetes: 2018 A1c 6.0.  Tries not to drink sweetened drinks, but has gained weight, not following a diabetic diet at this time.  Past Medical History:  Diagnosis Date  . Allergy   . Anxiety   . Graves disease 05/07/2017  . Headache   . Heart murmur   . Hypertension   . Hyperthyroidism   . Irritability     History reviewed. No pertinent surgical history.  Family History  Problem Relation Age of Onset  . Hyperlipidemia Father   . Hypertension Father   . Alcoholism Maternal Uncle   . Prostate cancer Paternal Uncle   . Mental illness Maternal Uncle   . Thyroid disease Neg Hx  Social History   Socioeconomic History  . Marital status: Married    Spouse name: Not on file  . Number of children: Not on file  . Years of education: Not on file  . Highest education level: Not on file  Occupational History  . Not on file  Tobacco Use  . Smoking status: Light Tobacco Smoker  . Smokeless tobacco: Never Used  . Tobacco comment: 1 per day, on weekend 4-5 cig  Substance and Sexual Activity  . Alcohol use: Yes    Alcohol/week: 0.0 standard drinks  . Drug use: No  . Sexual activity: Not on file  Other Topics Concern  . Not on file  Social History Narrative  . Not on file   Social Determinants of Health   Financial  Resource Strain:   . Difficulty of Paying Living Expenses:   Food Insecurity:   . Worried About Charity fundraiser in the Last Year:   . Arboriculturist in the Last Year:   Transportation Needs:   . Film/video editor (Medical):   Marland Kitchen Lack of Transportation (Non-Medical):   Physical Activity:   . Days of Exercise per Week:   . Minutes of Exercise per Session:   Stress:   . Feeling of Stress :   Social Connections:   . Frequency of Communication with Friends and Family:   . Frequency of Social Gatherings with Friends and Family:   . Attends Religious Services:   . Active Member of Clubs or Organizations:   . Attends Archivist Meetings:   Marland Kitchen Marital Status:   Intimate Partner Violence:   . Fear of Current or Ex-Partner:   . Emotionally Abused:   Marland Kitchen Physically Abused:   . Sexually Abused:     Outpatient Medications Prior to Visit  Medication Sig Dispense Refill  . amLODipine (NORVASC) 5 MG tablet TAKE 1 TABLET BY MOUTH EVERY DAY 90 tablet 1  . clonazePAM (KLONOPIN) 0.5 MG tablet TAKE 1 TABLET (0.5 MG TOTAL) BY MOUTH 2 (TWO) TIMES DAILY AS NEEDED FOR ANXIETY. 60 tablet 0  . Multiple Vitamin (MULTI-VITAMINS) TABS Take by mouth.    . sertraline (ZOLOFT) 100 MG tablet TAKE 2 TABLETS BY MOUTH DAILY. PATIENT NEEDS TO MAKE AN APPOINTMENT FOR MORE REFILLS. 180 tablet 0  . levothyroxine (SYNTHROID, LEVOTHROID) 112 MCG tablet Take 224 mcg by mouth daily.     No facility-administered medications prior to visit.    No Known Allergies   Review of Systems  Constitutional: Negative for chills and fever.  HENT: Positive for congestion. Negative for sinus pressure and sore throat.   Eyes: Negative.   Respiratory: Negative for cough and shortness of breath.   Cardiovascular: Negative for chest pain, palpitations and leg swelling.  Gastrointestinal: Positive for abdominal pain and blood in stool. Negative for constipation, diarrhea, nausea, rectal pain and vomiting.       See HPI   Endocrine:       Positive fatigue and weight gain.  Genitourinary: Negative for difficulty urinating and frequency.  Musculoskeletal: Negative.   Skin: Negative.   Allergic/Immunologic: Negative.   Neurological: Negative for dizziness, light-headedness and headaches.  Hematological: Negative.   Psychiatric/Behavioral:       See HPI. Denies suicidal or homicidal thoughts.      Objective:    Physical Exam  Constitutional: He is oriented to person, place, and time. He appears well-developed and well-nourished.  HENT:  Head: Normocephalic and atraumatic.  Eyes: Pupils are equal,  round, and reactive to light.  Cardiovascular: Normal rate and regular rhythm.  Pulmonary/Chest: Effort normal and breath sounds normal.  Abdominal: Soft. Bowel sounds are normal. He exhibits no distension. There is abdominal tenderness. There is no rigidity, no rebound, no guarding and no tenderness at McBurney's point. Hernia confirmed negative in the right inguinal area.    Abdomen is obese and protuberant.   Rectal exam with normal appearing anus and no hemorrhoids,  tags, fissures, or soiling. Tight sphincter tone and unable to perform DRE with one attempt.    Musculoskeletal:        General: Normal range of motion.     Cervical back: Normal range of motion and neck supple.  Neurological: He is alert and oriented to person, place, and time.  Skin: Skin is warm and dry.  Psychiatric: He has a normal mood and affect. His behavior is normal. Judgment and thought content normal.    BP (!) 148/98   Pulse 82   Temp (!) 96.3 F (35.7 C) (Temporal)   Ht 6' (1.829 m)   Wt 271 lb 1.9 oz (123 kg)   SpO2 98%   BMI 36.77 kg/m  Wt Readings from Last 3 Encounters:  03/06/20 271 lb 1.9 oz (123 kg)  06/07/19 271 lb 12.8 oz (123.3 kg)  12/25/18 263 lb (119.3 kg)     Health Maintenance Due  Topic Date Due  . HIV Screening  Never done  . TETANUS/TDAP  01/15/2019    There are no preventive care  reminders to display for this patient.     Assessment & Plan:   Problem List Items Addressed This Visit    None    Visit Diagnoses    Abdominal pain, RLQ    -  Primary   Relevant Orders   CT Abdomen Pelvis W Contrast   Abdominal mass, RLQ (right lower quadrant)       Relevant Orders   CT Abdomen Pelvis W Contrast   BRBPR (bright red blood per rectum)       Relevant Orders   CBC with Differential/Platelet (Completed)   Comp Met (CMET) (Completed)   Ambulatory referral to Gastroenterology   FH: colon polyps       Relevant Orders   Ambulatory referral to Gastroenterology   Class 2 severe obesity with serious comorbidity and body mass index (BMI) of 36.0 to 36.9 in adult, unspecified obesity type ([place])       Relevant Orders   HgB A1c (Completed)   TSH (Completed)   Lipid Profile (Completed)   Anxiety and depression         He presented today for concerns over abdominal hernia, and passage of bright red blood per rectum at intervals. He also notes serious wt gain over the last 1.5 years and that over the pandemic year he has not been taking care of himself. He notes that he is overdue for general laboratory studies and would like to get those today.  I reviewed his past medical history as noted above and targeted the recommended labs. BP elevated today.  We discussed healthy diet, avoiding sweetened drinks. Until the CT abd results are known, I  advised avoidance of heavy lifting, bending or gym activity until we see what the CT shows.  Please go to the lab after this visit.  I have ordered a CT of the abdomen and pelvis to evaluate right lower quadrant bulge and mild tenderness.   I have ordered a referral to gastroenterology to  evaluate your rectal bleeding and family history of colon polyps. Suspect lower outlet bleeding at intervals and advised to use hemorrhoidal suppositories when he sees blood. Keep stools soft with stool softener and avoid and straining and constipation. Alarm  signs to seek care reviewed.  He was due for repeat PHQ 9 and the results came back elevated. We discussed this today. He reports he is really at his baseline and may have over- reported some of his answers. He denies any suicidal ideation. He does not want to adjust his Zoloft dose or talk with a counselor at this time. Appt with his PCP in 2 weeks for follow-up.    This visit occurred during the SARS-CoV-2 public health emergency.  Safety protocols were in place, including screening questions prior to the visit, additional usage of staff PPE, and extensive cleaning of exam room while observing appropriate contact time as indicated for disinfecting solutions.   A total of 35 minutes of face to face time was spent with patient more than half of which was spent in counseling and coordination of care   Follow-up: Return in about 2 weeks (around 03/20/2020).    Denice Paradise, NP

## 2020-03-13 ENCOUNTER — Ambulatory Visit
Admission: RE | Admit: 2020-03-13 | Discharge: 2020-03-13 | Disposition: A | Discharge status: Home / Self Care | Payer: 59 | Source: Ambulatory Visit | Attending: Nurse Practitioner | Admitting: Nurse Practitioner

## 2020-03-13 ENCOUNTER — Other Ambulatory Visit: Payer: Self-pay

## 2020-03-13 DIAGNOSIS — R1031 Right lower quadrant pain: Secondary | ICD-10-CM | POA: Diagnosis not present

## 2020-03-13 DIAGNOSIS — R1903 Right lower quadrant abdominal swelling, mass and lump: Secondary | ICD-10-CM | POA: Diagnosis present

## 2020-03-13 MED ORDER — IOHEXOL 300 MG/ML  SOLN
100.0000 mL | Freq: Once | INTRAMUSCULAR | Status: AC | PRN
  Administered 2020-03-13: 100 mL via INTRAVENOUS

## 2020-03-14 ENCOUNTER — Telehealth: Payer: Self-pay | Admitting: Nurse Practitioner

## 2020-03-14 DIAGNOSIS — E890 Postprocedural hypothyroidism: Secondary | ICD-10-CM

## 2020-03-14 DIAGNOSIS — R7309 Other abnormal glucose: Secondary | ICD-10-CM

## 2020-03-14 NOTE — Telephone Encounter (Signed)
I called him and we discussed the results of his CT of the abdomen pelvis:  IMPRESSION: 1. Moderate size fat containing umbilical hernia with minimal stranding within the fat. No fluid. 2. Moderate fat containing bilateral inguinal hernias. 3. Signs of hepatic steatosis with lobular hepatic contours. Correlate with any clinical or laboratory evidence of liver disease.  PLAN: 1. He has had no abdominal pain today. We discussed the hernias. General surgery consult is recommended. Patient wants to check with his family to see who they might recommend. He can d/w Dr. De Nurse at f/up visit 03/24/20.   2. Liver: fatty liver and lobular liver. He  has morbid obesity, newly Dx DM2, HLD-not on statin and is working on his weight loss. GI to follow liver disease as well.   3. Rectal bleeding: Intermittent and e has a GI consultation arranged for 04/10/20  for  rectal bleeding. He was advised to use Anusol suppositories twice daily, stool softener and MiraLAX as needed she will avoid straining and hard stools.   4. He has newly diagnosed diabetes with A1c 7, and needs a nurse visit to go over glucometer, basic diabetes teaching. He has an appointment with his primary care provider in 2 weeks.

## 2020-03-14 NOTE — Telephone Encounter (Signed)
Please call him and ask him to come in for FASTING blood work. He will need an appt. This is to verify whether he has DM or not. I will recheck his TSH, as well. He is followed by Dr. Tedd Sias in ENDO .

## 2020-03-14 NOTE — Telephone Encounter (Signed)
Do you know if his labs were fasting? If so we could go ahead and start on metformin 500 mg BID and crestor 40 20 mg daily. If his labs were not fasting then we can not technically diagnose him with diabetes as he has to have two tests (such as an A1c, fasting glucose >125, or any glucose >200) that meet the criteria for diabetes prior to diagnosing him with diabetes. If he was not fasting then he should come in for a fasting glucose test.

## 2020-03-17 NOTE — Telephone Encounter (Signed)
Patient has appointment with Dr Birdie Sons [id number]

## 2020-03-24 ENCOUNTER — Other Ambulatory Visit: Payer: Self-pay

## 2020-03-24 ENCOUNTER — Other Ambulatory Visit: Payer: Self-pay | Admitting: Family Medicine

## 2020-03-24 ENCOUNTER — Encounter: Payer: Self-pay | Admitting: Family Medicine

## 2020-03-24 ENCOUNTER — Ambulatory Visit: Payer: 59 | Admitting: Family Medicine

## 2020-03-24 VITALS — BP 140/80 | HR 85 | Temp 97.2°F | Ht 72.0 in | Wt 268.6 lb

## 2020-03-24 DIAGNOSIS — R7309 Other abnormal glucose: Secondary | ICD-10-CM | POA: Diagnosis not present

## 2020-03-24 DIAGNOSIS — E119 Type 2 diabetes mellitus without complications: Secondary | ICD-10-CM | POA: Insufficient documentation

## 2020-03-24 DIAGNOSIS — K429 Umbilical hernia without obstruction or gangrene: Secondary | ICD-10-CM

## 2020-03-24 DIAGNOSIS — E890 Postprocedural hypothyroidism: Secondary | ICD-10-CM

## 2020-03-24 DIAGNOSIS — I10 Essential (primary) hypertension: Secondary | ICD-10-CM

## 2020-03-24 DIAGNOSIS — E669 Obesity, unspecified: Secondary | ICD-10-CM

## 2020-03-24 DIAGNOSIS — E785 Hyperlipidemia, unspecified: Secondary | ICD-10-CM | POA: Diagnosis not present

## 2020-03-24 DIAGNOSIS — F419 Anxiety disorder, unspecified: Secondary | ICD-10-CM

## 2020-03-24 DIAGNOSIS — E1169 Type 2 diabetes mellitus with other specified complication: Secondary | ICD-10-CM | POA: Insufficient documentation

## 2020-03-24 MED ORDER — ROSUVASTATIN CALCIUM 20 MG PO TABS: 20.0000 mg | ORAL_TABLET | Freq: Every day | ORAL | 3 refills | Status: DC

## 2020-03-24 MED ORDER — AMLODIPINE BESYLATE 10 MG PO TABS: 10.0000 mg | ORAL_TABLET | Freq: Every day | ORAL | 1 refills | Status: DC

## 2020-03-24 MED ORDER — CLONAZEPAM 0.5 MG PO TABS: 0.5000 mg | ORAL_TABLET | Freq: Two times a day (BID) | ORAL | 0 refills | Status: DC | PRN

## 2020-03-24 NOTE — Assessment & Plan Note (Signed)
Increase amlodipine.  Recheck in 1 month.

## 2020-03-24 NOTE — Telephone Encounter (Signed)
Refill request for klonopin, last seen 03-24-20, last filled 01-30-19.   Please advise.

## 2020-03-24 NOTE — Assessment & Plan Note (Signed)
Plan to recheck his TSH and free T4.  He needs to see his endocrinologist if it is still elevated.

## 2020-03-24 NOTE — Assessment & Plan Note (Signed)
Refer to general surgery.  Given hernia return precautions.

## 2020-03-24 NOTE — Assessment & Plan Note (Signed)
Discussed diet and exercise.  Given dietary guidelines.

## 2020-03-24 NOTE — Assessment & Plan Note (Signed)
The 10-year ASCVD risk score Denman George DC Montez Hageman., et al., 2013) is: 10%   Values used to calculate the score:     Age: 44 years     Sex: Male     Is Non-Hispanic African American: No     Diabetic: No     Tobacco smoker: Yes     Systolic Blood Pressure: 140 mmHg     Is BP treated: Yes     HDL Cholesterol: 41.4 mg/dL     Total Cholesterol: 232 mg/dL  Plan to start statin therapy.  This was sent to his pharmacy.

## 2020-03-24 NOTE — Patient Instructions (Signed)
Nice to see you. We will have you return for fasting labs. We are going to increase your amlodipine to 10 mg once daily. We will have you follow-up with general surgery for your hernias. Seek medical attention if you develop a hernia that you are not able to reduce or if you develop significant pain, nausea, vomiting, or lack of bowel movements. Please work on diet and continuing with exercise.  Diet Recommendations  Starchy (carb) foods: Bread, rice, pasta, potatoes, corn, cereal, grits, crackers, bagels, muffins, all baked goods.  (Fruits, milk, and yogurt also have carbohydrate, but most of these foods will not spike your blood sugar as the starchy foods will.)  A few fruits do cause high blood sugars; use small portions of bananas (limit to 1/2 at a time), grapes, watermelon, oranges, and most tropical fruits.    Protein foods: Meat, fish, poultry, eggs, dairy foods, and beans such as pinto and kidney beans (beans also provide carbohydrate).   1. Eat at least 3 meals and 1-2 snacks per day. Never go more than 4-5 hours while awake without eating. Eat breakfast within the first hour of getting up.   2. Limit starchy foods to TWO per meal and ONE per snack. ONE portion of a starchy  food is equal to the following:   - ONE slice of bread (or its equivalent, such as half of a hamburger bun).   - 1/2 cup of a "scoopable" starchy food such as potatoes or rice.   - 15 grams of carbohydrate as shown on food label.  3. Include at every meal: a protein food, a carb food, and vegetables and/or fruit.   - Obtain twice the volume of veg's as protein or carbohydrate foods for both lunch and dinner.   - Fresh or frozen veg's are best.   - Keep frozen veg's on hand for a quick vegetable serving.

## 2020-03-24 NOTE — Progress Notes (Signed)
[name], MD Phone: [phone]  Edward Hurley is a 44 y.o. male who presents today for f/u.  Hypertension: Typically 145 over 90s.  Mild improvement since starting amlodipine.  No chest pain, shortness of breath, or edema.  Hypothyroidism: Continues on Synthroid.  TSH was elevated quite a bit on last check.  He has had weight increase.  No skin changes.  No heat or cold intolerance.  It is about 3 months until he sees endocrinology.  Elevated A1c: Patient was not fasting for his last labs.  Hyperlipidemia: Lipids elevated.  Umbilical hernia: No issues with incarceration or blockage.  Bowel movements daily though a little less frequently.  He does have rectal bleeding and he is seeing GI for that.  Has not been referred to general surgery yet.  Obesity: Patient has started to walk more and he is feeling better since starting that.  He is trying to make healthier choices.  Less junk food.  Less chips.  He is cut down on soda.  He is eating bananas.  Has not had anything today other than a additive to his water that contain sugar  Social History   Tobacco Use  Smoking Status Light Tobacco Smoker  Smokeless Tobacco Never Used  Tobacco Comment   1 per day, on weekend 4-5 cig     ROS see history of present illness  Objective  Physical Exam Vitals:   03/24/20 1534  BP: 140/80  Pulse: 85  Temp: (!) 97.2 F (36.2 C)  SpO2: 97%    BP Readings from Last 3 Encounters:  03/24/20 140/80  03/06/20 (!) 148/98  06/07/19 124/80   Wt Readings from Last 3 Encounters:  03/24/20 268 lb 9.6 oz (121.8 kg)  03/06/20 271 lb 1.9 oz (123 kg)  06/07/19 271 lb 12.8 oz (123.3 kg)    Physical Exam Constitutional:      General: He is not in acute distress.    Appearance: He is not diaphoretic.  Cardiovascular:     Rate and Rhythm: Normal rate and regular rhythm.     Heart sounds: Normal heart sounds.  Pulmonary:     Effort: Pulmonary effort is normal.     Breath sounds:  Normal breath sounds.  Abdominal:     General: Bowel sounds are normal. There is no distension.     Palpations: Abdomen is soft.     Tenderness: There is no abdominal tenderness. There is no guarding or rebound.     Hernia: A hernia (Umbilical) is present.  Musculoskeletal:     Right lower leg: No edema.     Left lower leg: No edema.  Skin:    General: Skin is warm and dry.  Neurological:     Mental Status: He is alert.      Assessment/Plan: Please see individual problem list.  Hypertension Increase amlodipine.  Recheck in 1 month.  Elevated hemoglobin A1c I suspect the patient does have diabetes that we do need to confirm this with a fasting glucose.  This has been ordered.  He will be scheduled for lab work sometime in the next week.  Postablative hypothyroidism Plan to recheck his TSH and free T4.  He needs to see his endocrinologist if it is still elevated.  Hyperlipidemia The 10-year ASCVD risk score Denman George DC Montez Hageman., et al., 2013) is: 10%   Values used to calculate the score:     Age: 27 years     Sex: Male     Is Non-Hispanic African American:  No     Diabetic: No     Tobacco smoker: Yes     Systolic Blood Pressure: 517 mmHg     Is BP treated: Yes     HDL Cholesterol: 41.4 mg/dL     Total Cholesterol: 232 mg/dL  Plan to start statin therapy.  This was sent to his pharmacy.  Obesity, Class II, BMI 35-39.9 Discussed diet and exercise.  Given dietary guidelines.  Umbilical hernia without obstruction and without gangrene Refer to general surgery.  Given hernia return precautions.    Orders Placed This Encounter  Procedures  . Glucose    Standing Status:   Future    Standing Expiration Date:   03/24/2021  . TSH    Standing Status:   Future    Standing Expiration Date:   03/24/2021  . T4, free    Standing Status:   Future    Standing Expiration Date:   03/24/2021  . Ambulatory referral to General Surgery    Referral Priority:   Routine    Referral Type:    Surgical    Referral Reason:   Specialty Services Required    Requested Specialty:   General Surgery    Number of Visits Requested:   1    Meds ordered this encounter  Medications  . amLODipine (NORVASC) 10 MG tablet    Sig: Take 1 tablet (10 mg total) by mouth daily.    Dispense:  90 tablet    Refill:  1  . rosuvastatin (CRESTOR) 20 MG tablet    Sig: Take 1 tablet (20 mg total) by mouth daily.    Dispense:  90 tablet    Refill:  3    This visit occurred during the SARS-CoV-2 public health emergency.  Safety protocols were in place, including screening questions prior to the visit, additional usage of staff PPE, and extensive cleaning of exam room while observing appropriate contact time as indicated for disinfecting solutions.    [name], MD [place]

## 2020-03-24 NOTE — Assessment & Plan Note (Signed)
I suspect the patient does have diabetes that we do need to confirm this with a fasting glucose.  This has been ordered.  He will be scheduled for lab work sometime in the next week.

## 2020-03-27 ENCOUNTER — Ambulatory Visit (INDEPENDENT_AMBULATORY_CARE_PROVIDER_SITE_OTHER): Payer: 59 | Admitting: General Surgery

## 2020-03-27 ENCOUNTER — Encounter: Payer: Self-pay | Admitting: General Surgery

## 2020-03-27 ENCOUNTER — Other Ambulatory Visit: Payer: Self-pay

## 2020-03-27 VITALS — BP 152/83 | HR 99 | Temp 96.6°F | Ht 72.0 in | Wt 267.0 lb

## 2020-03-27 DIAGNOSIS — K429 Umbilical hernia without obstruction or gangrene: Secondary | ICD-10-CM

## 2020-03-27 DIAGNOSIS — K4020 Bilateral inguinal hernia, without obstruction or gangrene, not specified as recurrent: Secondary | ICD-10-CM

## 2020-03-27 NOTE — Patient Instructions (Addendum)
Dr.Cannon recommends patient to try and stop smoking prior to surgery. Patient will give our office a call once he has discussed the surgical treatment with his family.  Dr.Cannon discussed with patient that any heavy lifting can cause the hernia to bulge more.   Umbilical Hernia, Adult  A hernia is a bulge of tissue that pushes through an opening between muscles. An umbilical hernia happens in the abdomen, near the belly button (umbilicus). The hernia may contain tissues from the small intestine, large intestine, or fatty tissue covering the intestines (omentum). Umbilical hernias in adults tend to get worse over time, and they require surgical treatment. There are several types of umbilical hernias. You may have:  A hernia located just above or below the umbilicus (indirect hernia). This is the most common type of umbilical hernia in adults.  A hernia that forms through an opening formed by the umbilicus (direct hernia).  A hernia that comes and goes (reducible hernia). A reducible hernia may be visible only when you strain, lift something heavy, or cough. This type of hernia can be pushed back into the abdomen (reduced).  A hernia that traps abdominal tissue inside the hernia (incarcerated hernia). This type of hernia cannot be reduced.  A hernia that cuts off blood flow to the tissues inside the hernia (strangulated hernia). The tissues can start to die if this happens. This type of hernia requires emergency treatment. What are the causes? An umbilical hernia happens when tissue inside the abdomen presses on a weak area of the abdominal muscles. What increases the risk? You may have a greater risk of this condition if you:  Are obese.  Have had several pregnancies.  Have a buildup of fluid inside your abdomen (ascites).  Have had surgery that weakens the abdominal muscles. What are the signs or symptoms? The main symptom of this condition is a painless bulge at or near the belly  button. A reducible hernia may be visible only when you strain, lift something heavy, or cough. Other symptoms may include:  Dull pain.  A feeling of pressure. Symptoms of a strangulated hernia may include:  Pain that gets increasingly worse.  Nausea and vomiting.  Pain when pressing on the hernia.  Skin over the hernia becoming red or purple.  Constipation.  Blood in the stool. How is this diagnosed? This condition may be diagnosed based on:  A physical exam. You may be asked to cough or strain while standing. These actions increase the pressure inside your abdomen and force the hernia through the opening in your muscles. Your health care provider may try to reduce the hernia by pressing on it.  Your symptoms and medical history. How is this treated? Surgery is the only treatment for an umbilical hernia. Surgery for a strangulated hernia is done as soon as possible. If you have a small hernia that is not incarcerated, you may need to lose weight before having surgery. Follow these instructions at home:  Lose weight, if told by your health care provider.  Do not try to push the hernia back in.  Watch your hernia for any changes in color or size. Tell your health care provider if any changes occur.  You may need to avoid activities that increase pressure on your hernia.  Do not lift anything that is heavier than 10 lb (4.5 kg) until your health care provider says that this is safe.  Take over-the-counter and prescription medicines only as told by your health care provider.  Keep  all follow-up visits as told by your health care provider. This is important. Contact a health care provider if:  Your hernia gets larger.  Your hernia becomes painful. Get help right away if:  You develop sudden, severe pain near the area of your hernia.  You have pain as well as nausea or vomiting.  You have pain and the skin over your hernia changes color.  You develop a fever. This  information is not intended to replace advice given to you by your health care provider. Make sure you discuss any questions you have with your health care provider. Document Revised: 12/21/2017 Document Reviewed: 05/09/2017 Elsevier Patient Education  [employer].   Inguinal Hernia, Adult An inguinal hernia develops when fat or the intestines push through a weak spot in a muscle where your leg meets your lower abdomen (groin). This creates a bulge. This kind of hernia could also be:  In your scrotum, if you are male.  In folds of skin around your vagina, if you are male. There are three types of inguinal hernias:  Hernias that can be pushed back into the abdomen (are reducible). This type rarely causes pain.  Hernias that are not reducible (are incarcerated).  Hernias that are not reducible and lose their blood supply (are strangulated). This type of hernia requires emergency surgery. What are the causes? This condition is caused by having a weak spot in the muscles or tissues in the groin. This weak spot develops over time. The hernia may poke through the weak spot when you suddenly strain your lower abdominal muscles, such as when you:  Lift a heavy object.  Strain to have a bowel movement. Constipation can lead to straining.  Cough. What increases the risk? This condition is more likely to develop in:  Men.  Pregnant women.  People who: ? Are overweight. ? Work in jobs that require long periods of standing or heavy lifting. ? Have had an inguinal hernia before. ? Smoke or have lung disease. These factors can lead to long-lasting (chronic) coughing. What are the signs or symptoms? Symptoms may depend on the size of the hernia. Often, a small inguinal hernia has no symptoms. Symptoms of a larger hernia may include:  A lump in the groin area. This is easier to see when standing. It might not be visible when lying down.  Pain or burning in the groin. This may get  worse when lifting, straining, or coughing.  A dull ache or a feeling of pressure in the groin.  In men, an unusual lump in the scrotum. Symptoms of a strangulated inguinal hernia may include:  A bulge in your groin that is very painful and tender to the touch.  A bulge that turns red or purple.  Fever, nausea, and vomiting.  Inability to have a bowel movement or to pass gas. How is this diagnosed? This condition is diagnosed based on your symptoms, your medical history, and a physical exam. Your health care provider may feel your groin area and ask you to cough. How is this treated? Treatment depends on the size of your hernia and whether you have symptoms. If you do not have symptoms, your health care provider may have you watch your hernia carefully and have you come in for follow-up visits. If your hernia is large or if you have symptoms, you may need surgery to repair the hernia. Follow these instructions at home: Lifestyle  Avoid lifting heavy objects.  Avoid standing for long periods of  time.  Do not use any products that contain nicotine or tobacco, such as cigarettes and e-cigarettes. If you need help quitting, ask your health care provider.  Maintain a healthy weight. Preventing constipation  Take actions to prevent constipation. Constipation leads to straining with bowel movements, which can make a hernia worse or cause a hernia repair to break down. Your health care provider may recommend that you: ? Drink enough fluid to keep your urine pale yellow. ? Eat foods that are high in fiber, such as fresh fruits and vegetables, whole grains, and beans. ? Limit foods that are high in fat and processed sugars, such as fried or sweet foods. ? Take an over-the-counter or prescription medicine for constipation. General instructions  You may try to push the hernia back in place by very gently pressing on it while lying down. Do not try to force the bulge back in if it will not  push in easily.  Watch your hernia for any changes in shape, size, or color. Get help right away if you notice any changes.  Take over-the-counter and prescription medicines only as told by your health care provider.  Keep all follow-up visits as told by your health care provider. This is important. Contact a health care provider if:  You have a fever.  You develop new symptoms.  Your symptoms get worse. Get help right away if:  You have pain in your groin that suddenly gets worse.  You have a bulge in your groin that: ? Suddenly gets bigger and does not get smaller. ? Becomes red or purple or painful to the touch.  You are a man and you have a sudden pain in your scrotum, or the size of your scrotum suddenly changes.  You cannot push the hernia back in place by very gently pressing on it when you are lying down. Do not try to force the bulge back in if it will not push in easily.  You have nausea or vomiting that does not go away.  You have a fast heartbeat.  You cannot have a bowel movement or pass gas. These symptoms may represent a serious problem that is an emergency. Do not wait to see if the symptoms will go away. Get medical help right away. Call your local emergency services (911 in the [place] Summary  An inguinal hernia develops when fat or the intestines push through a weak spot in a muscle where your leg meets your lower abdomen (groin).  This condition is caused by having a weak spot in muscles or tissue in your groin.  Symptoms may depend on the size of the hernia, and they may include pain or swelling in your groin. A small inguinal hernia often has no symptoms.  Treatment may not be needed if you do not have symptoms. If you have symptoms or a large hernia, you may need surgery to repair the hernia.  Avoid lifting heavy objects. Also avoid standing for long amounts of time. This information is not intended to replace advice given to you by your health care  provider. Make sure you discuss any questions you have with your health care provider. Document Revised: 12/10/2017 Document Reviewed: 08/10/2017 Elsevier Patient Education  2020 [employer].

## 2020-03-28 NOTE — Progress Notes (Signed)
Patient ID: Edward Hurley, male   DOB: 08-05-1976, 44 y.o.   MRN: [id number]  Chief Complaint  Patient presents with  . New Patient (Initial Visit)    new patient  Umbilical hernia without obstruction and without gangrene Dr.  Caryl Bis    HPI Edward Hurley is a 44 y.o. male.   He has been referred by his primary care provider, Dr. [name], for surgical evaluation of an umbilical hernia.  He says that he first noticed it about a month ago.  It is not specifically painful, but is uncomfortable with bending, lifting, twisting, or other activity.  He denies any nausea, vomiting, fevers, chills, or other concern for obstruction.  A CT scan of the abdomen and pelvis was ordered.  This revealed not only an umbilical hernia, but bilateral inguinal hernias.  Edward Hurley states that he is not experiencing any symptoms in the inguinal areas.  He is interested in possible repair of his umbilical hernia.  He has no prior history of abdominal surgery.  He does have a history of Graves' disease, status post radioactive iodine ablation.  He is followed by Dr. Lavone Orn.   Past Medical History:  Diagnosis Date  . Allergy   . Anxiety   . Graves disease 05/07/2017  . Headache   . Heart murmur   . Hypertension   . Hyperthyroidism   . Irritability     History reviewed. No pertinent surgical history.  Family History  Problem Relation Age of Onset  . Hyperlipidemia Father   . Hypertension Father   . Alcoholism Maternal Uncle   . Prostate cancer Paternal Uncle   . Mental illness Maternal Uncle   . Thyroid disease Neg Hx     Social History Social History   Tobacco Use  . Smoking status: Light Tobacco Smoker  . Smokeless tobacco: Never Used  . Tobacco comment: 1 per day, on weekend 4-5 cig  Substance Use Topics  . Alcohol use: Yes    Alcohol/week: 0.0 standard drinks  . Drug use: No    No Known Allergies  Current Outpatient Medications  Medication Sig Dispense  Refill  . amLODipine (NORVASC) 10 MG tablet Take 1 tablet (10 mg total) by mouth daily. 90 tablet 1  . clonazePAM (KLONOPIN) 0.5 MG tablet Take 1 tablet (0.5 mg total) by mouth 2 (two) times daily as needed for anxiety. 60 tablet 0  . Multiple Vitamin (MULTI-VITAMINS) TABS Take by mouth.    . rosuvastatin (CRESTOR) 20 MG tablet Take 1 tablet (20 mg total) by mouth daily. 90 tablet 3  . sertraline (ZOLOFT) 100 MG tablet TAKE 2 TABLETS BY MOUTH DAILY. PATIENT NEEDS TO MAKE AN APPOINTMENT FOR MORE REFILLS. 180 tablet 0  . levothyroxine (SYNTHROID, LEVOTHROID) 112 MCG tablet Take 224 mcg by mouth daily.     No current facility-administered medications for this visit.    Review of Systems Review of Systems  Constitutional: Positive for unexpected weight change.       Significant weight gain secondary to inactivity during the COVID-19 pandemic.  All other systems reviewed and are negative.   Blood pressure (!) 152/83, pulse 99, temperature (!) 96.6 F (35.9 C), temperature source Temporal, height 6' (1.829 m), weight 267 lb (121.1 kg), SpO2 95 %. Body mass index is 36.21 kg/m.  Physical Exam Physical Exam Constitutional:      General: He is not in acute distress.    Appearance: Normal appearance. He is obese.  HENT:  Head: Atraumatic.     Nose:     Comments: Covered with a mask secondary to COVID-19 precautions    Mouth/Throat:     Comments: Covered with a mask secondary to COVID-19 precautions Eyes:     General: No scleral icterus.       Right eye: No discharge.        Left eye: No discharge.     Comments: No proptosis or exophthalmos.  No periorbital edema.  Neck:     Comments: The thyroid is not palpable, consistent with his history of radioactive iodine ablation. Cardiovascular:     Rate and Rhythm: Normal rate and regular rhythm.     Pulses: Normal pulses.  Pulmonary:     Effort: Pulmonary effort is normal. No respiratory distress.     Breath sounds: Normal breath  sounds.  Abdominal:     General: Bowel sounds are normal.     Palpations: Abdomen is soft.     Hernia: A hernia is present.     Comments: He has a reducible umbilical hernia.  Genitourinary:      Comments: Bilateral inguinal hernias are present, only appreciated with Valsalva. Musculoskeletal:        General: No deformity or signs of injury.  Lymphadenopathy:     Cervical: No cervical adenopathy.  Skin:    General: Skin is warm and dry.  Neurological:     General: No focal deficit present.     Mental Status: He is alert and oriented to person, place, and time.  Psychiatric:        Mood and Affect: Mood normal.        Behavior: Behavior normal.     Data Reviewed I reviewed a number of clinic notes from his primary care office.  These include an office visit from March 06 2020 where he complained of abdominal pain which resulted in the CT scan of the abdomen and pelvis.  He saw Dr. Birdie Sons on Mar 24, 2020 which resulted in his referral to general surgery for further evaluation and management.    I also reviewed his endocrinology clinic visit note from January 30, 2020 this basically determined that his hypothyroidism was controlled on his current medication regimen.  I personally reviewed the CT scan images that were obtained.  I concur with the radiologist's impression, copied here: CLINICAL DATA:  RIGHT lower quadrant abdominal pain, of question of hernia. Bulge in RIGHT lower quadrant near belly button.  EXAM: CT ABDOMEN AND PELVIS WITH CONTRAST  TECHNIQUE: Multidetector CT imaging of the abdomen and pelvis was performed using the standard protocol following bolus administration of intravenous contrast.  CONTRAST:  OMNIPAQUE IOHEXOL 300 MG/ML  SOLN  COMPARISON:  None.  FINDINGS: Lower chest: No consolidation or pleural effusion. Lung bases are clear.  Hepatobiliary: Lobulated hepatic contours with low attenuation throughout the liver. No focal,  suspicious hepatic lesion. Portal vein is patent. Hepatic veins are patent. Gallbladder is normal without pericholecystic stranding. No gross ductal dilation.  Pancreas: Pancreas is normal without focal lesion, ductal dilation or inflammation.  Spleen: Spleen is normal size.  Adrenals/Urinary Tract: Adrenal glands are normal.  Kidneys with smooth contours bilaterally, symmetric enhancement. No hydronephrosis.  Stomach/Bowel: Stomach is unremarkable, limited assessment due to under distension. No perigastric stranding. No small bowel dilation. Normal appendix. Stool throughout the colon.  Vascular/Lymphatic: Vascular structures in the abdomen are patent. No adenopathy. No aneurysm or significant atherosclerosis.  No pelvic adenopathy.  Reproductive: Heterogeneous prostate. Normal urinary bladder.  Other: Moderate-size fat containing umbilical hernia. Aperture approximately 3.2 x 2.6 cm containing only fat. The mild stranding. No fluid.  Moderate fat containing bilateral inguinal hernias as well.  Musculoskeletal: No acute or destructive bone process.  IMPRESSION: 1. Moderate size fat containing umbilical hernia with minimal stranding within the fat. No fluid. 2. Moderate fat containing bilateral inguinal hernias. 3. Signs of hepatic steatosis with lobular hepatic contours. Correlate with any clinical or laboratory evidence of liver disease.  Assessment and plan: This is a 44 year old male with a symptomatic umbilical hernia and asymptomatic bilateral inguinal hernias.  He is currently smoking and is obese.  He is interested in surgical repair, but I would like him to quit smoking prior to surgery as well as get his BMI under 35.  I discussed both umbilical and inguinal hernias with him, including warning signs to be aware of for which he should present to the emergency department.  I discussed the risks of repair of both umbilical and inguinal hernias.  He  indicated that he would prefer to just address the umbilical hernia at this time but that he would discuss it further with his family.  He will contact our office when he has decided how he would like to proceed.  If he wishes to have all 3 hernias repaired in a single operation, I will refer him to one of my colleagues who performs laparoscopic or robotic treatment of hernias as this is the preferred approach for management of multiple hernias.     Duanne Guess 03/28/2020, 9:15 AM

## 2020-03-31 ENCOUNTER — Other Ambulatory Visit: Payer: Self-pay

## 2020-03-31 ENCOUNTER — Other Ambulatory Visit (INDEPENDENT_AMBULATORY_CARE_PROVIDER_SITE_OTHER): Payer: 59

## 2020-03-31 DIAGNOSIS — R7309 Other abnormal glucose: Secondary | ICD-10-CM | POA: Diagnosis not present

## 2020-03-31 DIAGNOSIS — E890 Postprocedural hypothyroidism: Secondary | ICD-10-CM

## 2020-03-31 LAB — GLUCOSE, RANDOM: Glucose, Bld: 136 mg/dL — ABNORMAL HIGH (ref 70–99)

## 2020-03-31 LAB — TSH: TSH: 3.85 u[IU]/mL (ref 0.35–4.50)

## 2020-03-31 LAB — T4, FREE: Free T4: 1.02 ng/dL (ref 0.60–1.60)

## 2020-04-01 ENCOUNTER — Telehealth: Payer: Self-pay

## 2020-04-01 DIAGNOSIS — R7309 Other abnormal glucose: Secondary | ICD-10-CM

## 2020-04-01 MED ORDER — METFORMIN HCL 500 MG PO TABS: 500.0000 mg | ORAL_TABLET | Freq: Two times a day (BID) | ORAL | 1 refills | Status: DC

## 2020-04-01 NOTE — Telephone Encounter (Signed)
-----   Message from Glori Luis, MD sent at 03/31/2020 12:11 PM EDT ----- Please let the patient know that his glucose is elevated in the diabetic range.  I would like to start him on Metformin 500 mg by mouth 1 tablet twice daily.  You can send in 180 tablets with 1 refill.  His thyroid labs are in an acceptable range at this time.

## 2020-04-01 NOTE — Telephone Encounter (Signed)
I called and informed the patient of his lab results. Patient is willing to start the metformin 500 mg bid and I sent this medication to his pharmacy with 1 refill.  Yajayra Feldt,cma

## 2020-04-07 ENCOUNTER — Other Ambulatory Visit: Payer: Self-pay

## 2020-04-07 ENCOUNTER — Encounter: Payer: Self-pay | Admitting: Gastroenterology

## 2020-04-07 ENCOUNTER — Ambulatory Visit (INDEPENDENT_AMBULATORY_CARE_PROVIDER_SITE_OTHER): Payer: 59 | Admitting: Gastroenterology

## 2020-04-07 ENCOUNTER — Telehealth: Payer: Self-pay

## 2020-04-07 VITALS — BP 138/100 | HR 86 | Temp 98.2°F | Ht 72.0 in | Wt 263.0 lb

## 2020-04-07 DIAGNOSIS — K625 Hemorrhage of anus and rectum: Secondary | ICD-10-CM | POA: Diagnosis not present

## 2020-04-07 NOTE — Patient Instructions (Signed)
Please give [place] a call or send [place] a MyChart message once you and your wife decide on a date for your colonoscopy.

## 2020-04-07 NOTE — Telephone Encounter (Signed)
Patient will call us back whenever he and his wife decide on a date for him to have his colonoscopy.

## 2020-04-07 NOTE — Progress Notes (Signed)
[name] [address]  [place]  [place], [place] [address]  Main: [phone]  Fax: [phone]   Gastroenterology Consultation  Referring Provider:     Leone Haven, MD Primary Care Physician:  Leone Haven, MD Reason for Consultation:     Blood per rectum        HPI:   Chief complaint: Bright blood per rectum  Edward Hurley is a 44 y.o. y/o male referred for consultation & management  by Dr. Caryl Bis, Angela Adam, MD. Patient reports 35-month history of bright red blood per rectum, intermittently. Was occurring. Times a week, but recently started taking stool softener and has been less in frequency. Reports 1-3 bowel movements a day that are formed. Sometimes has straining. No family history of colon cancer. No abdominal pain, nausea or vomiting. No dysphagia. No prior EGD or colonoscopy.  Past Medical History:  Diagnosis Date  . Allergy   . Anxiety   . Graves disease 05/07/2017  . Headache   . Heart murmur   . Hypertension   . Hyperthyroidism   . Irritability     No past surgical history on file.  Prior to Admission medications   Medication Sig Start Date End Date Taking? Authorizing Provider  amLODipine (NORVASC) 10 MG tablet Take 1 tablet (10 mg total) by mouth daily. 03/24/20   Leone Haven, MD  clonazePAM (KLONOPIN) 0.5 MG tablet Take 1 tablet (0.5 mg total) by mouth 2 (two) times daily as needed for anxiety. 03/24/20   Leone Haven, MD  levothyroxine (SYNTHROID, LEVOTHROID) 112 MCG tablet Take 224 mcg by mouth daily. 03/09/18 03/09/19  [provider]  metFORMIN (GLUCOPHAGE) 500 MG tablet Take 1 tablet (500 mg total) by mouth 2 (two) times daily with a meal. 04/01/20   Leone Haven, MD  Multiple Vitamin (MULTI-VITAMINS) TABS Take by mouth.    [provider]  rosuvastatin (CRESTOR) 20 MG tablet Take 1 tablet (20 mg total) by mouth daily. 03/24/20   Leone Haven, MD  sertraline (ZOLOFT) 100 MG tablet  TAKE 2 TABLETS BY MOUTH DAILY. PATIENT NEEDS TO MAKE AN APPOINTMENT FOR MORE REFILLS. 01/28/20   Leone Haven, MD    Family History  Problem Relation Age of Onset  . Hyperlipidemia Father   . Hypertension Father   . Alcoholism Maternal Uncle   . Prostate cancer Paternal Uncle   . Mental illness Maternal Uncle   . Thyroid disease Neg Hx      Social History   Tobacco Use  . Smoking status: Light Tobacco Smoker  . Smokeless tobacco: Never Used  . Tobacco comment: 1 per day, on weekend 4-5 cig  Substance Use Topics  . Alcohol use: Yes    Alcohol/week: 0.0 standard drinks  . Drug use: No    Allergies as of 04/07/2020  . (No Known Allergies)    Review of Systems:    All systems reviewed and negative except where noted in HPI.   Physical Exam:   Vitals:   04/07/20 1337  BP: (!) 138/100  Pulse: 86  Temp: 98.2 F (36.8 C)  TempSrc: Oral  Weight: 263 lb (119.3 kg)  Height: 6' (1.829 m)    No LMP for male patient. Psych:  Alert and cooperative. Normal mood and affect. General:   Alert,  Well-developed, well-nourished, pleasant and cooperative in NAD Head:  Normocephalic and atraumatic. Eyes:  Sclera clear, no icterus.   Conjunctiva pink. Ears:  Normal auditory  acuity. Nose:  No deformity, discharge, or lesions. Mouth:  No deformity or lesions,oropharynx pink & moist. Neck:  Supple; no masses or thyromegaly. Abdomen:  Normal bowel sounds.  No bruits.  Soft, non-tender and non-distended without masses, hepatosplenomegaly or hernias noted.  No guarding or rebound tenderness.    Rectal exam: No external hemorrhoids, normal perianal exam, DRE with no blood, no stool in the rectal vault  Msk:  Symmetrical without gross deformities. Good, equal movement & strength bilaterally. Pulses:  Normal pulses noted. Extremities:  No clubbing or edema.  No cyanosis. Neurologic:  Alert and oriented x3;  grossly normal neurologically. Skin:  Intact without significant lesions or  rashes. No jaundice. Lymph Nodes:  No significant cervical adenopathy. Psych:  Alert and cooperative. Normal mood and affect.    Labs: CBC    Component Value Date/Time   WBC 8.6 03/06/2020 1024   RBC 4.32 03/06/2020 1024   HGB 14.5 03/06/2020 1024   HCT 40.8 03/06/2020 1024   PLT 230.0 03/06/2020 1024   MCV 94.4 03/06/2020 1024   MCH 31.2 05/06/2017 1544   MCHC 35.5 03/06/2020 1024   RDW 13.3 03/06/2020 1024   LYMPHSABS 2.0 03/06/2020 1024   MONOABS 0.6 03/06/2020 1024   EOSABS 0.2 03/06/2020 1024   BASOSABS 0.1 03/06/2020 1024   CMP     Component Value Date/Time   NA 140 03/06/2020 1024   K 4.1 03/06/2020 1024   CL 103 03/06/2020 1024   CO2 29 03/06/2020 1024   GLUCOSE 136 (H) 03/31/2020 0852   BUN 15 03/06/2020 1024   CREATININE 1.00 03/06/2020 1024   CREATININE 0.89 05/06/2017 1544   CALCIUM 9.4 03/06/2020 1024   PROT 7.3 03/06/2020 1024   ALBUMIN 4.8 03/06/2020 1024   AST 25 03/06/2020 1024   ALT 40 03/06/2020 1024   ALKPHOS 59 03/06/2020 1024   BILITOT 0.4 03/06/2020 1024    Imaging Studies: CT Abdomen Pelvis W Contrast  Result Date: 03/14/2020 CLINICAL DATA:  RIGHT lower quadrant abdominal pain, of question of hernia. Bulge in RIGHT lower quadrant near belly button. EXAM: CT ABDOMEN AND PELVIS WITH CONTRAST TECHNIQUE: Multidetector CT imaging of the abdomen and pelvis was performed using the standard protocol following bolus administration of intravenous contrast. CONTRAST:  OMNIPAQUE IOHEXOL 300 MG/ML  SOLN COMPARISON:  None. FINDINGS: Lower chest: No consolidation or pleural effusion. Lung bases are clear. Hepatobiliary: Lobulated hepatic contours with low attenuation throughout the liver. No focal, suspicious hepatic lesion. Portal vein is patent. Hepatic veins are patent. Gallbladder is normal without pericholecystic stranding. No gross ductal dilation. Pancreas: Pancreas is normal without focal lesion, ductal dilation or inflammation. Spleen: Spleen is  normal size. Adrenals/Urinary Tract: Adrenal glands are normal. Kidneys with smooth contours bilaterally, symmetric enhancement. No hydronephrosis. Stomach/Bowel: Stomach is unremarkable, limited assessment due to under distension. No perigastric stranding. No small bowel dilation. Normal appendix. Stool throughout the colon. Vascular/Lymphatic: Vascular structures in the abdomen are patent. No adenopathy. No aneurysm or significant atherosclerosis. No pelvic adenopathy. Reproductive: Heterogeneous prostate. Normal urinary bladder. Other: Moderate-size fat containing umbilical hernia. Aperture approximately 3.2 x 2.6 cm containing only fat. The mild stranding. No fluid. Moderate fat containing bilateral inguinal hernias as well. Musculoskeletal: No acute or destructive bone process. IMPRESSION: 1. Moderate size fat containing umbilical hernia with minimal stranding within the fat. No fluid. 2. Moderate fat containing bilateral inguinal hernias. 3. Signs of hepatic steatosis with lobular hepatic contours. Correlate with any clinical or laboratory evidence of liver disease. Electronically  Signed   By: Donzetta Kohut M.D.   On: 03/14/2020 13:00    Assessment and Plan:   Edward Hurley is a 44 y.o. y/o male has been referred for bright red blood per rectum  Given normal hemoglobin and clinical findings, likely sources possible internal hemorrhoids  However, we discussed in detail options of proceeding with colonoscopy, versus conservative management and risks and benefits of both options.  Patient would prefer to proceed with colonoscopy at this time to rule out malignancy or any other underlying lesions  I have discussed alternative options, risks & benefits,  which include, but are not limited to, bleeding, infection, perforation,respiratory complication & drug reaction.  The patient agrees with this plan & written consent will be obtained.    High-fiber diet MiraLAX or Metamucil daily with goal  of 1-2 soft bowel movements daily.  If not at goal, patient instructed to increase dose to twice daily.  If loose stools with the medication, patient asked to decrease the medication to every other day, or half dose daily.  Patient verbalized understanding   Dr Melodie Bouillon  Speech recognition software was used to dictate the above note.

## 2020-04-09 ENCOUNTER — Encounter: Payer: Self-pay | Admitting: Family Medicine

## 2020-04-10 ENCOUNTER — Ambulatory Visit: Payer: 59 | Admitting: Gastroenterology

## 2020-04-11 ENCOUNTER — Telehealth: Payer: 59 | Admitting: Family Medicine

## 2020-04-15 NOTE — Telephone Encounter (Signed)
Patient did not pick up my call when I called him. [place],  I will go ahead and send him a MyChart message and ask if he ans his wife had decided on a date for his colonoscopy.

## 2020-04-25 ENCOUNTER — Ambulatory Visit: Payer: 59 | Admitting: Family Medicine

## 2020-04-25 DIAGNOSIS — Z0289 Encounter for other administrative examinations: Secondary | ICD-10-CM

## 2020-05-04 ENCOUNTER — Other Ambulatory Visit: Payer: Self-pay | Admitting: Family Medicine

## 2020-05-07 ENCOUNTER — Ambulatory Visit: Payer: 59 | Admitting: Family Medicine

## 2020-05-09 ENCOUNTER — Other Ambulatory Visit: Payer: Self-pay | Admitting: Family Medicine

## 2020-05-09 DIAGNOSIS — F419 Anxiety disorder, unspecified: Secondary | ICD-10-CM

## 2020-05-09 MED ORDER — CLONAZEPAM 0.5 MG PO TABS: 0.5000 mg | ORAL_TABLET | Freq: Two times a day (BID) | ORAL | 0 refills | Status: DC | PRN

## 2020-05-09 NOTE — Telephone Encounter (Signed)
Refill request for klonopin, last seen 03-24-20, last filled 03-24-20.  Please advise.

## 2020-05-14 ENCOUNTER — Ambulatory Visit: Payer: 59 | Admitting: Family Medicine

## 2020-05-14 ENCOUNTER — Encounter: Payer: Self-pay | Admitting: Family Medicine

## 2020-06-23 IMAGING — CT CT ABD-PELV W/ CM
2 of 5 series · 16 of 46 positions shown, 18 images · IV contrast (omnipaque)
Comparison: None.

CLINICAL DATA: RIGHT lower quadrant abdominal pain, of question of
hernia. Bulge in RIGHT lower quadrant near belly button.

EXAM:
CT ABDOMEN AND PELVIS WITH CONTRAST
TECHNIQUE: Multidetector CT imaging of the abdomen and pelvis was performed
using the standard protocol following bolus administration of
intravenous contrast.
CONTRAST:  100mL OMNIPAQUE IOHEXOL 300 MG/ML  SOLN

[Series 2: abd pelvis 5.00 · axial · 0.74mm/px · z∈[-1571,-1126]mm · 13 of 101 slices shown, 15 images]
[im 6/101  soft-tissue]
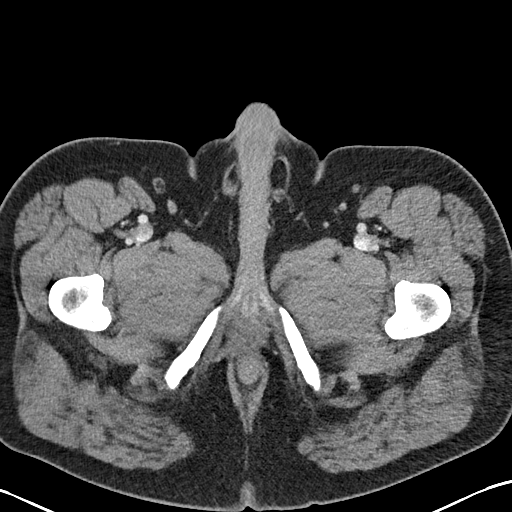
[im 6/101  bone]
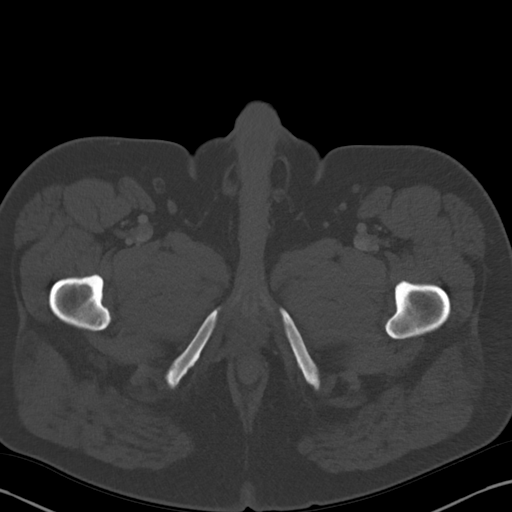
[im 12/101  soft-tissue]
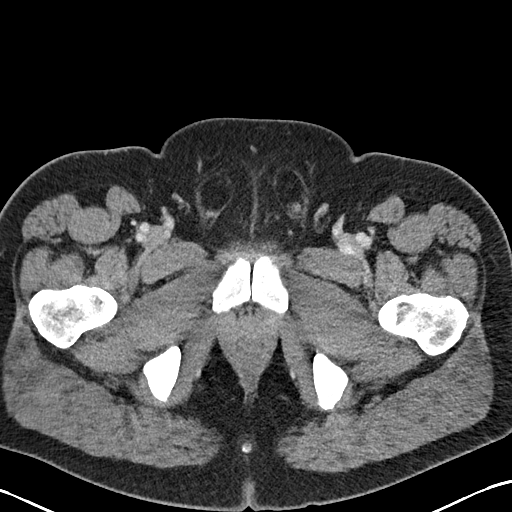
[im 23/101  soft-tissue]
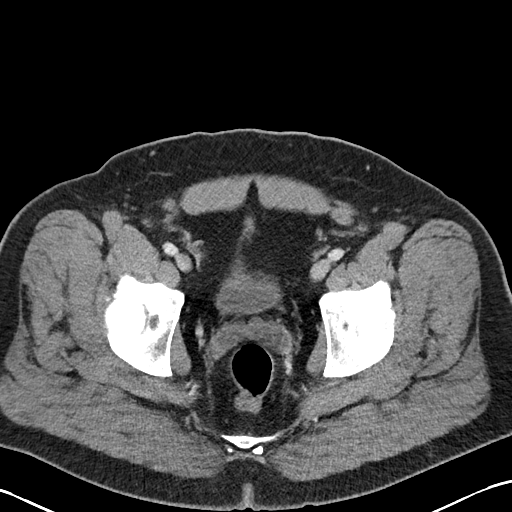
[im 28/101  soft-tissue]
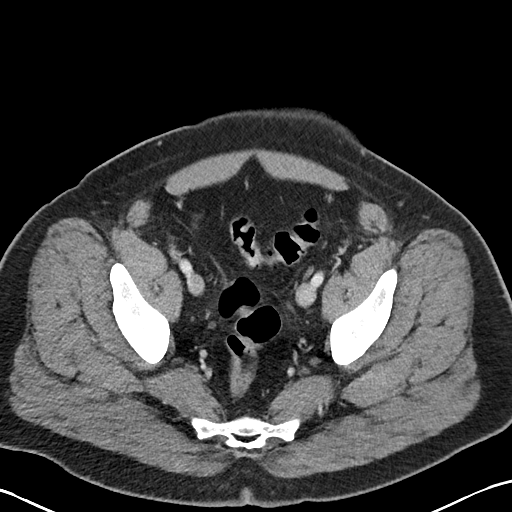
[im 34/101  soft-tissue]
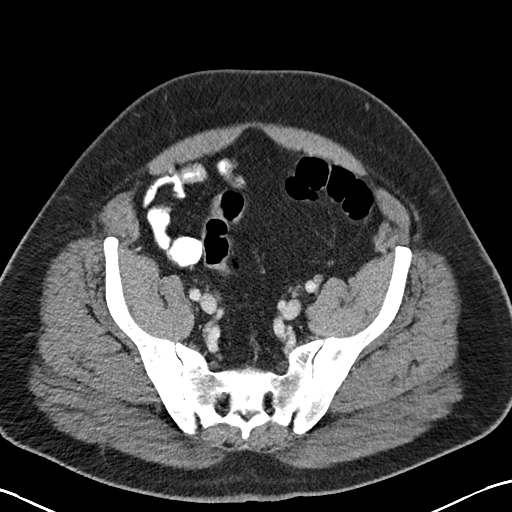
[im 45/101  soft-tissue]
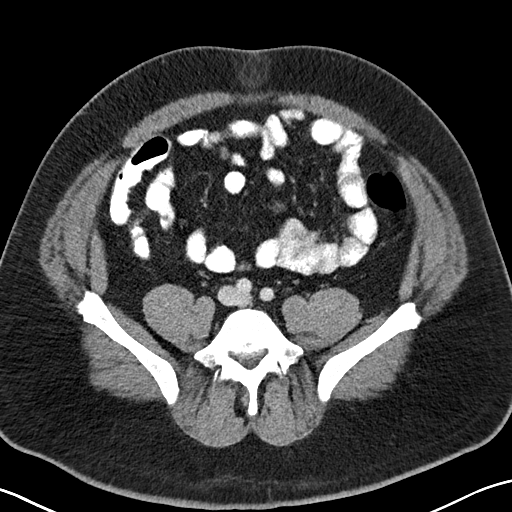
[im 51/101  soft-tissue]
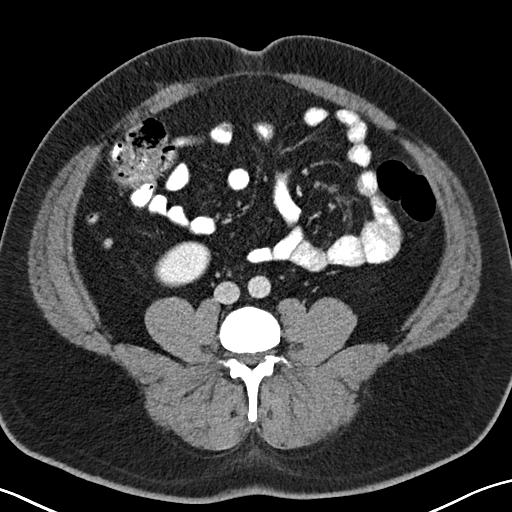
[im 56/101  soft-tissue]
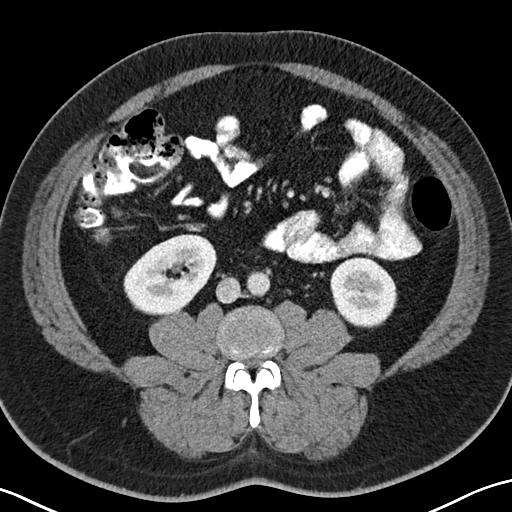
[im 67/101  soft-tissue]
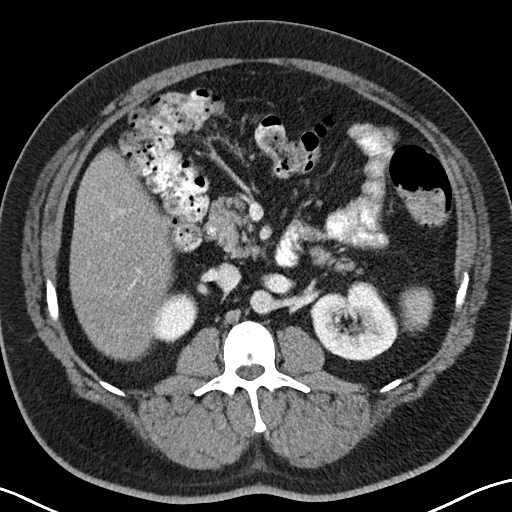
[im 67/101  bone]
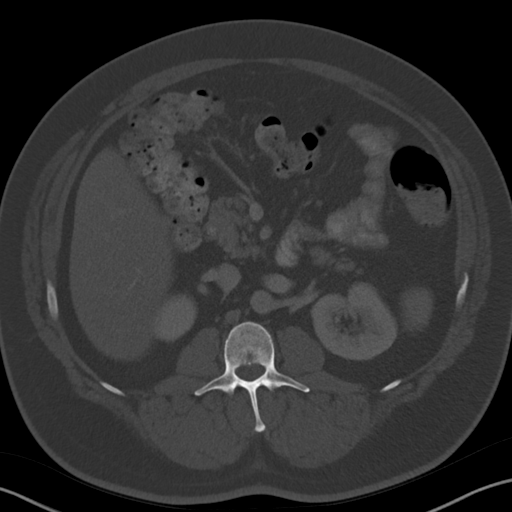
[im 73/101  soft-tissue]
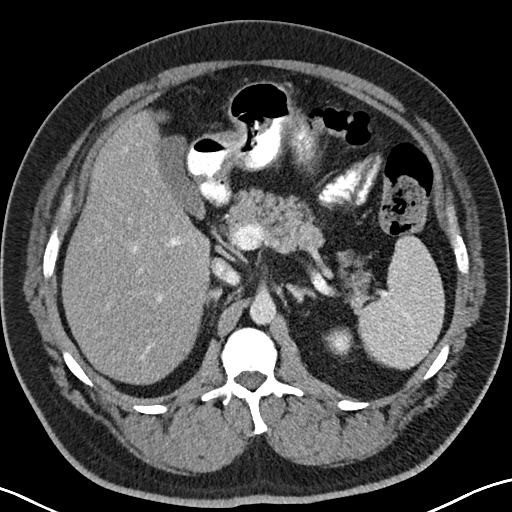
[im 78/101  soft-tissue]
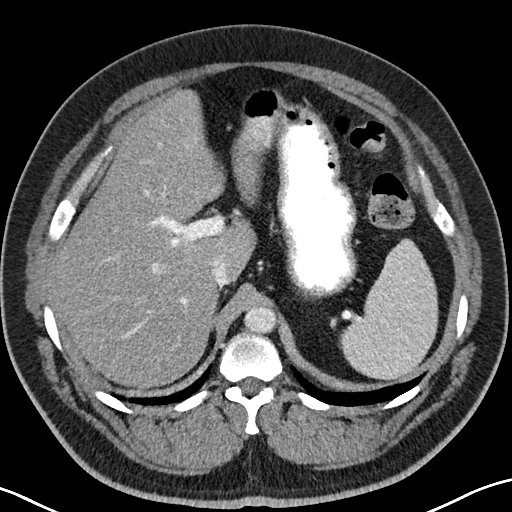
[im 89/101  soft-tissue]
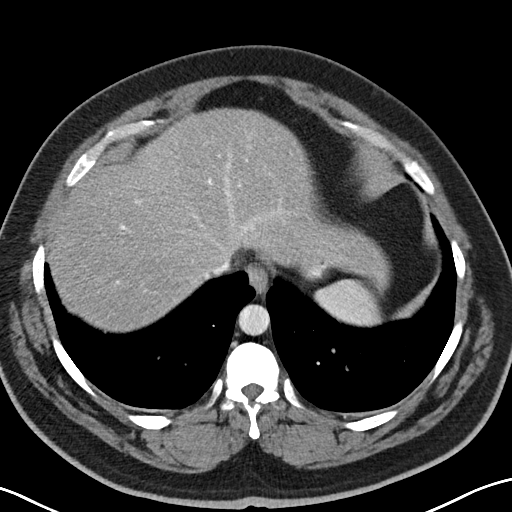
[im 95/101  soft-tissue]
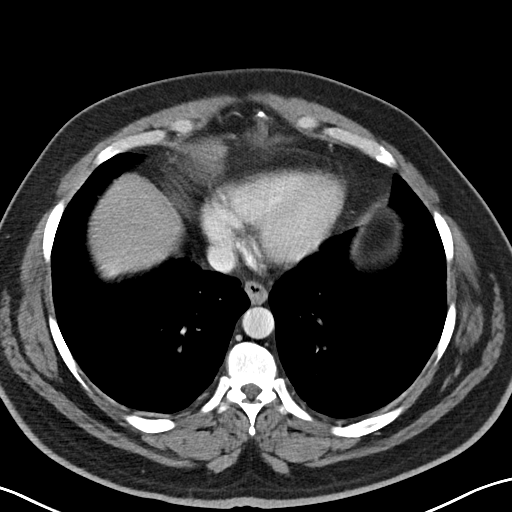

[Series 4: coronals abd pelvis 2.00 cor · coronal · 0.74mm/px · 3 of 184 slices shown]
[im 62/184  soft-tissue]
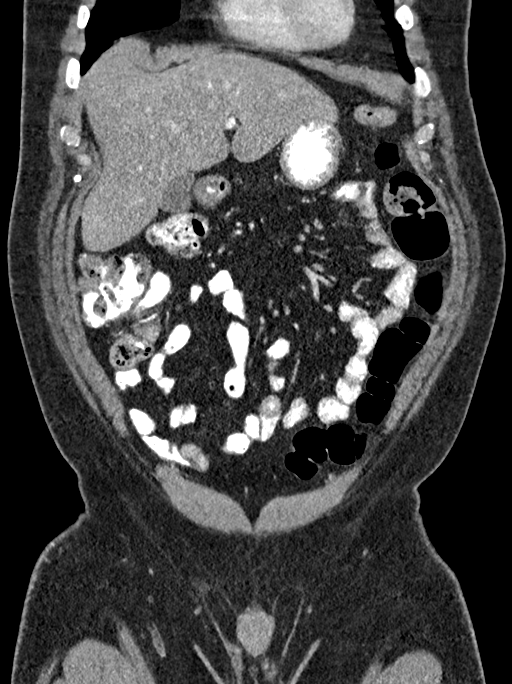
[im 82/184  soft-tissue]
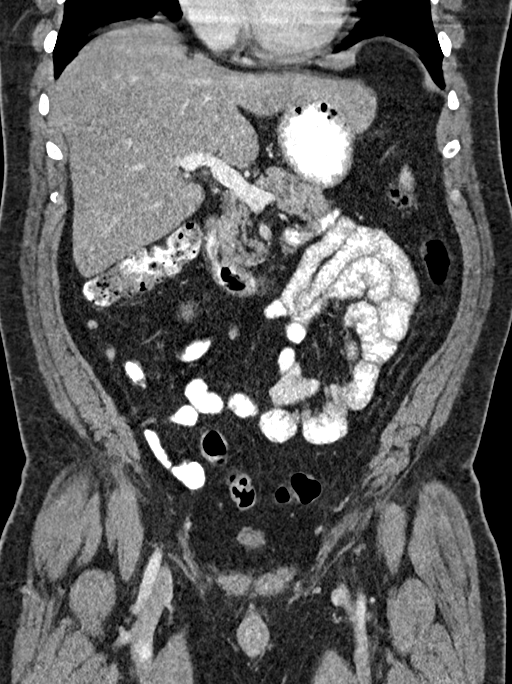
[im 102/184  soft-tissue]
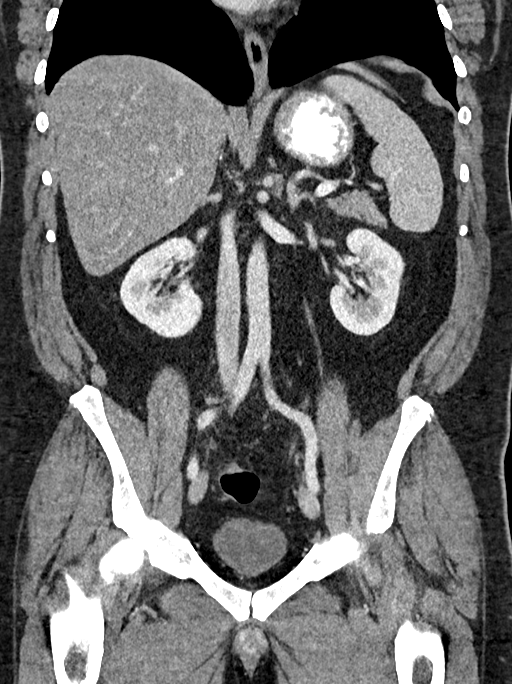

[16 of 46 positions shown; findings below may reference images not displayed]

FINDINGS: Lower chest: No consolidation or pleural effusion. Lung bases are
clear.

Hepatobiliary: Lobulated hepatic contours with low attenuation
throughout the liver. No focal, suspicious hepatic lesion. Portal
vein is patent. Hepatic veins are patent. Gallbladder is normal
without pericholecystic stranding. No gross ductal dilation.

Pancreas: Pancreas is normal without focal lesion, ductal dilation
or inflammation.

Spleen: Spleen is normal size.

Adrenals/Urinary Tract: Adrenal glands are normal.

Kidneys with smooth contours bilaterally, symmetric enhancement. No
hydronephrosis.

Stomach/Bowel: Stomach is unremarkable, limited assessment due to
under distension. No perigastric stranding. No small bowel dilation.
Normal appendix. Stool throughout the colon.

Vascular/Lymphatic: Vascular structures in the abdomen are patent.
No adenopathy. No aneurysm or significant atherosclerosis.

No pelvic adenopathy.

Reproductive: Heterogeneous prostate. Normal urinary bladder.

Other: Moderate-size fat containing umbilical hernia. Aperture
approximately 3.2 x 2.6 cm containing only fat. The mild stranding.
No fluid.

Moderate fat containing bilateral inguinal hernias as well.

Musculoskeletal: No acute or destructive bone process.
IMPRESSION: 1. Moderate size fat containing umbilical hernia with minimal
stranding within the fat. No fluid.
2. Moderate fat containing bilateral inguinal hernias.
3. Signs of hepatic steatosis with lobular hepatic contours.
Correlate with any clinical or laboratory evidence of liver disease.

## 2020-06-27 ENCOUNTER — Other Ambulatory Visit: Payer: Self-pay | Admitting: Family Medicine

## 2020-06-27 DIAGNOSIS — F419 Anxiety disorder, unspecified: Secondary | ICD-10-CM

## 2020-06-27 NOTE — Telephone Encounter (Signed)
This patient canceled or no showed his last 4 visits.  He needs to complete a visit to have this refilled.

## 2020-08-08 ENCOUNTER — Other Ambulatory Visit: Payer: Self-pay | Admitting: Family Medicine

## 2020-10-01 ENCOUNTER — Other Ambulatory Visit: Payer: Self-pay | Admitting: Family Medicine

## 2020-10-29 ENCOUNTER — Other Ambulatory Visit: Payer: Self-pay | Admitting: Family Medicine

## 2020-10-29 DIAGNOSIS — R7309 Other abnormal glucose: Secondary | ICD-10-CM

## 2020-12-22 ENCOUNTER — Other Ambulatory Visit: Payer: Self-pay | Admitting: Family Medicine

## 2021-03-30 ENCOUNTER — Other Ambulatory Visit: Payer: Self-pay | Admitting: Family Medicine

## 2021-06-04 ENCOUNTER — Ambulatory Visit: Payer: Self-pay | Admitting: General Surgery

## 2021-06-11 ENCOUNTER — Telehealth: Payer: Self-pay | Admitting: General Surgery

## 2021-06-11 ENCOUNTER — Ambulatory Visit: Payer: 59 | Admitting: General Surgery

## 2021-06-11 ENCOUNTER — Other Ambulatory Visit: Payer: Self-pay

## 2021-06-11 ENCOUNTER — Encounter: Payer: Self-pay | Admitting: General Surgery

## 2021-06-11 VITALS — BP 170/125 | HR 56 | Temp 98.3°F | Ht 72.0 in | Wt 254.0 lb

## 2021-06-11 DIAGNOSIS — K429 Umbilical hernia without obstruction or gangrene: Secondary | ICD-10-CM

## 2021-06-11 NOTE — Patient Instructions (Addendum)
Our suregry scheduler will call you within 24-48 hours to schedule your surgery. Please have the Blue surgery sheet available when speaking with her.    Umbilical Hernia, Adult  A hernia is a bulge of tissue that pushes through an opening between muscles. An umbilical hernia happens in the abdomen, near the belly button (umbilicus). The hernia may contain tissues from the small intestine, large intestine, or fatty tissue covering the intestines (omentum). Umbilical hernias in adults tend to get worse over time, and they requiresurgical treatment. There are several types of umbilical hernias. You may have: A hernia located just above or below the umbilicus (indirect hernia). This is the most common type of umbilical hernia in adults. A hernia that forms through an opening formed by the umbilicus (direct hernia). A hernia that comes and goes (reducible hernia). A reducible hernia may be visible only when you strain, lift something heavy, or cough. This type of hernia can be pushed back into the abdomen (reduced). A hernia that traps abdominal tissue inside the hernia (incarcerated hernia). This type of hernia cannot be reduced. A hernia that cuts off blood flow to the tissues inside the hernia (strangulated hernia). The tissues can start to die if this happens. This type of hernia requires emergency treatment. What are the causes? An umbilical hernia happens when tissue inside the abdomen presses on a weakarea of the abdominal muscles. What increases the risk? You may have a greater risk of this condition if you: Are obese. Have had several pregnancies. Have a buildup of fluid inside your abdomen (ascites). Have had surgery that weakens the abdominal muscles. What are the signs or symptoms? The main symptom of this condition is a painless bulge at or near the belly button. A reducible hernia may be visible only when you strain, lift something heavy, or cough. Other symptoms may include: Dull  pain. A feeling of pressure. Symptoms of a strangulated hernia may include: Pain that gets increasingly worse. Nausea and vomiting. Pain when pressing on the hernia. Skin over the hernia becoming red or purple. Constipation. Blood in the stool. How is this diagnosed? This condition may be diagnosed based on: A physical exam. You may be asked to cough or strain while standing. These actions increase the pressure inside your abdomen and force the hernia through the opening in your muscles. Your health care provider may try to reduce the hernia by pressing on it. Your symptoms and medical history. How is this treated? Surgery is the only treatment for an umbilical hernia. Surgery for a strangulated hernia is done as soon as possible. If you have a small herniathat is not incarcerated, you may need to lose weight before having surgery. Follow these instructions at home: Lose weight, if told by your health care provider. Do not try to push the hernia back in. Watch your hernia for any changes in color or size. Tell your health care provider if any changes occur. You may need to avoid activities that increase pressure on your hernia. Do not lift anything that is heavier than 10 lb (4.5 kg) until your health care provider says that this is safe. Take over-the-counter and prescription medicines only as told by your health care provider. Keep all follow-up visits as told by your health care provider. This is important. Contact a health care provider if: Your hernia gets larger. Your hernia becomes painful. Get help right away if: You develop sudden, severe pain near the area of your hernia. You have pain as well  as nausea or vomiting. You have pain and the skin over your hernia changes color. You develop a fever. This information is not intended to replace advice given to you by your health care provider. Make sure you discuss any questions you have with your healthcare provider. Document  Revised: 12/21/2017 Document Reviewed: 05/09/2017 Elsevier Patient Education  2021 [employer].

## 2021-06-11 NOTE — Telephone Encounter (Signed)
Outgoing call made, left message for patient to call me so that we can discuss possible surgery dates for him.

## 2021-06-11 NOTE — Progress Notes (Signed)
Patient ID: Edward Hurley, male   DOB: 26-Aug-1976, 45 y.o.   MRN: [id number]  Chief Complaint  Patient presents with   Follow-up    Umbilical hernia    HPI Edward Hurley is a 45 y.o. male.   I initially saw him in May 2021.  I copied the HPI from that visit here:   "He has been referred by his primary care provider, Edward Hurley, for surgical evaluation of an umbilical hernia.  He says that he first noticed it about a month ago.  It is not specifically painful, but is uncomfortable with bending, lifting, twisting, or other activity.  He denies any nausea, vomiting, fevers, chills, or other concern for obstruction.  A CT scan of the abdomen and pelvis was ordered.  This revealed not only an umbilical hernia, but bilateral inguinal hernias.  Edward Hurley states that he is not experiencing any symptoms in the inguinal areas.  He is interested in possible repair of his umbilical hernia.  He has no prior history of abdominal surgery.  He does have a history of Graves' disease, status post radioactive iodine ablation.  He is followed by Edward Hurley."  At that time, his BMI was almost 37 and he was smoking.  He felt that since the inguinal hernias were not bothering him, he did not wish to proceed with repair, but did indicate a desire to have the umbilical hernia repaired.  He was going to discuss it further with his family.  Shortly thereafter, however, he had a change in insurance and the plan was not ideal, with high premiums and deductible.  He delayed seeking surgical intervention until he was able to obtain better health insurance.  He is here today to discuss the possibility of having just to the umbilical hernia repaired.  He says that it has become more noticeable and uncomfortable with movement.  He thinks it may be protruding more.  He denies any nausea or vomiting.  No difficulty with urination.  No fevers or chills.  He does report some constipation.  He also says that he  has noticed a little bit of blood in his stools, but thinks this is potentially hemorrhoidal bleeding.  He is working on getting a colonoscopy arranged.   Past Medical History:  Diagnosis Date   Allergy    Anxiety    Graves disease 05/07/2017   Headache    Heart murmur    Hypertension    Hyperthyroidism    Irritability     No past surgical history on file. Discussed with patient, no significant surgical history  Family History  Problem Relation Age of Onset   Hyperlipidemia Father    Hypertension Father    Alcoholism Maternal Uncle    Prostate cancer Paternal Uncle    Mental illness Maternal Uncle    Thyroid disease Neg Hx     Social History Social History   Tobacco Use   Smoking status: Light Smoker   Smokeless tobacco: Never   Tobacco comments:    1 per day, on weekend 4-5 cig  Substance Use Topics   Alcohol use: Yes    Alcohol/week: 0.0 standard drinks   Drug use: No    No Known Allergies  Current Outpatient Medications  Medication Sig Dispense Refill   amLODipine (NORVASC) 10 MG tablet TAKE 1 TABLET BY MOUTH EVERY DAY 90 tablet 1   clonazePAM (KLONOPIN) 0.5 MG tablet Take 1 tablet (0.5 mg total) by mouth 2 (two) times daily  as needed for anxiety. 14 tablet 0   metFORMIN (GLUCOPHAGE) 500 MG tablet TAKE 1 TABLET (500 MG TOTAL) BY MOUTH 2 (TWO) TIMES DAILY WITH A MEAL. 180 tablet 1   Multiple Vitamin (MULTI-VITAMINS) TABS Take by mouth.     sertraline (ZOLOFT) 100 MG tablet TAKE 2 TABLETS BY MOUTH DAILY. NEEDS APPT FOR MORE REFILLS. 180 tablet 0   levothyroxine (SYNTHROID, LEVOTHROID) 112 MCG tablet Take 224 mcg by mouth daily.     No current facility-administered medications for this visit.    Review of Systems Review of Systems  Gastrointestinal:  Positive for blood in stool.  All other systems reviewed and are negative. Or as discussed in the history of present illness.  Blood pressure (!) 170/125, pulse (!) 56, temperature 98.3 F (36.8 C),  temperature source Oral, height 6' (1.829 m), weight 254 lb (115.2 kg). Body mass index is 34.45 kg/m.  Physical Exam Physical Exam Constitutional:      General: He is not in acute distress.    Appearance: He is obese.  HENT:     Head: Normocephalic and atraumatic.     Nose:     Comments: Covered with a mask    Mouth/Throat:     Comments: Covered with a mask Eyes:     General: No scleral icterus.       Right eye: No discharge.        Left eye: No discharge.  Neck:     Comments: The trachea is midline.  No palpable cervical or supraclavicular lymphadenopathy.  The thyroid is not palpable. Cardiovascular:     Rate and Rhythm: Regular rhythm. Bradycardia present.     Pulses: Normal pulses.  Pulmonary:     Effort: Pulmonary effort is normal.     Breath sounds: Normal breath sounds.  Abdominal:     General: Bowel sounds are normal.     Palpations: Abdomen is soft.     Hernia: A hernia is present.     Comments: Small, reducible umbilical hernia.  Genitourinary:    Comments: Deferred Musculoskeletal:        General: No swelling or tenderness.  Skin:    General: Skin is warm and dry.  Neurological:     General: No focal deficit present.     Mental Status: He is alert and oriented to person, place, and time.  Psychiatric:        Mood and Affect: Mood normal.        Behavior: Behavior normal.   Data Reviewed I reviewed a number of clinic notes from his primary care office.  These include an office visit from March 06 2020 where he complained of abdominal pain which resulted in the CT scan of the abdomen and pelvis.  He saw Edward Hurley on Mar 24, 2020 which resulted in his referral to general surgery for further evaluation and management.    There is a clinic note from Apr 07, 2020 with Edward Hurley of gastroenterology regarding the bright red blood per rectum.  It appears that from this note, colonoscopy was recommended.  This has not yet occurred due to his problems with  insurance coverage.   I also reviewed his endocrinology clinic visit note from January 30, 2020 this basically determined that his hypothyroidism was controlled on his current medication regimen.  There were also a number of telephone notes from May of this year (2022) regarding his thyroid hormone refills.  No recent labs appear to have been obtained, secondary to  the patient's insurance issue   I personally reviewed the CT scan images that were obtained.  I concur with the radiologist's impression, copied here: CLINICAL DATA:  RIGHT lower quadrant abdominal pain, of question of hernia. Bulge in RIGHT lower quadrant near belly button.   EXAM: CT ABDOMEN AND PELVIS WITH CONTRAST   TECHNIQUE: Multidetector CT imaging of the abdomen and pelvis was performed using the standard protocol following bolus administration of intravenous contrast.   CONTRAST:  OMNIPAQUE IOHEXOL 300 MG/ML  SOLN   COMPARISON:  None.   FINDINGS: Lower chest: No consolidation or pleural effusion. Lung bases are clear.   Hepatobiliary: Lobulated hepatic contours with low attenuation throughout the liver. No focal, suspicious hepatic lesion. Portal vein is patent. Hepatic veins are patent. Gallbladder is normal without pericholecystic stranding. No gross ductal dilation.   Pancreas: Pancreas is normal without focal lesion, ductal dilation or inflammation.   Spleen: Spleen is normal size.   Adrenals/Urinary Tract: Adrenal glands are normal.   Kidneys with smooth contours bilaterally, symmetric enhancement. No hydronephrosis.   Stomach/Bowel: Stomach is unremarkable, limited assessment due to under distension. No perigastric stranding. No small bowel dilation. Normal appendix. Stool throughout the colon.   Vascular/Lymphatic: Vascular structures in the abdomen are patent. No adenopathy. No aneurysm or significant atherosclerosis.   No pelvic adenopathy.   Reproductive: Heterogeneous prostate. Normal  urinary bladder.   Other: Moderate-size fat containing umbilical hernia. Aperture approximately 3.2 x 2.6 cm containing only fat. The mild stranding. No fluid.   Moderate fat containing bilateral inguinal hernias as well.   Musculoskeletal: No acute or destructive bone process.   IMPRESSION: 1. Moderate size fat containing umbilical hernia with minimal stranding within the fat. No fluid. 2. Moderate fat containing bilateral inguinal hernias. 3. Signs of hepatic steatosis with lobular hepatic contours. Correlate with any clinical or laboratory evidence of liver disease.  Assessment This is a 45 year old man with an umbilical hernia, as well as bilateral inguinal hernias.  The umbilical hernia seems to be getting more symptomatic.  He continues to deny any symptoms from the inguinal hernias.  He would like to proceed with repair of the umbilical hernia only.  Plan I have offered him a robot-assisted laparoscopic umbilical hernia repair.  I discussed the risks of the procedure with the patient and his wife.  These include, but are not limited to, bleeding, infection, mesh migration or failure, hernia recurrence, damage to surrounding tissues or structures such as bowel or blood vessels.  I have strongly encouraged him to completely quit smoking; he says he only smokes 1 to 2 cigarettes a day and thinks he will be able to comply with this request.  He has lost an adequate amount of weight since the last time I saw him and I think we can safely proceed with surgical repair.    Duanne Guess 06/11/2021, 11:13 AM

## 2021-06-15 ENCOUNTER — Telehealth: Payer: Self-pay | Admitting: General Surgery

## 2021-06-15 NOTE — Telephone Encounter (Signed)
Left another message for patient to call me so that we can get him set up for surgery.

## 2021-06-18 ENCOUNTER — Telehealth: Payer: Self-pay | Admitting: General Surgery

## 2021-06-18 NOTE — Telephone Encounter (Signed)
Outgoing call is made again, left message for patient to call.  Also was able to get in touch with patient's wife, Morrie Sheldon.  She will give him the message to call.  She further states that he started a new job and they may be waiting for mid to late September for scheduling.

## 2021-07-30 ENCOUNTER — Other Ambulatory Visit: Payer: Self-pay | Admitting: Family Medicine

## 2021-08-19 ENCOUNTER — Telehealth: Payer: Self-pay | Admitting: Family Medicine

## 2021-08-19 MED ORDER — AMLODIPINE BESYLATE 10 MG PO TABS: 10.0000 mg | ORAL_TABLET | Freq: Every day | ORAL | 0 refills | Status: DC

## 2021-08-19 NOTE — Telephone Encounter (Signed)
Called to speak with [facility]. [facility] verbalized understanding to why klonopin was not refilled. Pt states that he has been stressed recently and asks if theres anything that can be done or sent in until his next appointment.

## 2021-08-19 NOTE — Telephone Encounter (Signed)
He will need to complete a visit to determine appropriate treatment. He could be scheduled for a sooner appointment if there is one available.

## 2021-08-19 NOTE — Telephone Encounter (Signed)
Patient is requesting a refill on his clonazePAM (KLONOPIN) 0.5 MG tablet and amLODipine (NORVASC) 10 MG tablet he is currently out of these medicines.Please send to [facility] and call the patient with an update.

## 2021-08-19 NOTE — Telephone Encounter (Signed)
He has not been seen in over a year. I will refill the amlodipine as a courtesy, though I will not refill the klonopin until he completes his visit as it is a controlled substance and requires follow-up at least every 3 months to get consistent refills.

## 2021-08-20 ENCOUNTER — Encounter: Payer: Self-pay | Admitting: General Surgery

## 2021-08-21 NOTE — Telephone Encounter (Signed)
Left a message to call back.

## 2021-08-25 NOTE — Telephone Encounter (Signed)
Left a message to call back. Pt needs a sooner appointment to discuss medications.

## 2021-08-25 NOTE — Telephone Encounter (Signed)
Patient scheduled for 09/01/21 @ 10 for a virtual visit.

## 2021-09-01 ENCOUNTER — Telehealth (INDEPENDENT_AMBULATORY_CARE_PROVIDER_SITE_OTHER): Payer: Self-pay | Admitting: Family Medicine

## 2021-09-01 ENCOUNTER — Encounter: Payer: Self-pay | Admitting: Family Medicine

## 2021-09-01 ENCOUNTER — Other Ambulatory Visit: Payer: Self-pay

## 2021-09-01 DIAGNOSIS — I10 Essential (primary) hypertension: Secondary | ICD-10-CM

## 2021-09-01 DIAGNOSIS — E119 Type 2 diabetes mellitus without complications: Secondary | ICD-10-CM

## 2021-09-01 DIAGNOSIS — F419 Anxiety disorder, unspecified: Secondary | ICD-10-CM

## 2021-09-01 DIAGNOSIS — F411 Generalized anxiety disorder: Secondary | ICD-10-CM | POA: Insufficient documentation

## 2021-09-01 DIAGNOSIS — E890 Postprocedural hypothyroidism: Secondary | ICD-10-CM

## 2021-09-01 DIAGNOSIS — E785 Hyperlipidemia, unspecified: Secondary | ICD-10-CM

## 2021-09-01 MED ORDER — HYDROXYZINE HCL 25 MG PO TABS: 25.0000 mg | ORAL_TABLET | Freq: Three times a day (TID) | ORAL | 0 refills | Status: DC | PRN

## 2021-09-01 NOTE — Progress Notes (Signed)
Virtual Visit via video Note  This visit type was conducted due to national recommendations for restrictions regarding the COVID-19 pandemic (e.g. social distancing).  This format is felt to be most appropriate for this patient at this time.  All issues noted in this document were discussed and addressed.  No physical exam was performed (except for noted visual exam findings with Video Visits).   I connected with Edward Hurley today at 10:00 AM EDT by a video enabled telemedicine application or telephone and verified that I am speaking with the correct person using two identifiers. Location patient: home Location provider: work  Persons participating in the virtual visit: patient, provider  I discussed the limitations, risks, security and privacy concerns of performing an evaluation and management service by telephone and the availability of in person appointments. I also discussed with the patient that there may be a patient responsible charge related to this service. The patient expressed understanding and agreed to proceed.  Reason for visit: f/u.  HPI: HYPOTHYROIDISM Disease Monitoring Weight changes: gain  Skin Changes: no  Heat/Cold intolerance: no  Medication Monitoring Compliance:  synthroid, managed by endocrinology   Last TSH:   Lab Results  Component Value Date   TSH 3.85 03/31/2020   HYPERTENSION Disease Monitoring Home BP Monitoring not checking  Chest pain- no    Dyspnea- no Medications Compliance-  taking amlodipine.  Edema- no BMET    Component Value Date/Time   NA 140 03/06/2020 1024   K 4.1 03/06/2020 1024   CL 103 03/06/2020 1024   CO2 29 03/06/2020 1024   GLUCOSE 136 (H) 03/31/2020 0852   BUN 15 03/06/2020 1024   CREATININE 1.00 03/06/2020 1024   CREATININE 0.89 05/06/2017 1544   CALCIUM 9.4 03/06/2020 1024   DIABETES Disease Monitoring: Blood Sugar ranges-not checking Polyuria/phagia/dipsia- no      Optho- UTD Medications: Compliance- taking  metformin Hypoglycemic symptoms- no Patient is not exercising.  He notes his diet has not been great since starting a new job working at [employer].  Anxiety: Patient notes his anxiety has its moments.  He goes up when he is interacting with others on the phone.  He is on Zoloft 200 mg once daily.  No depression.  Previously so he could benefit from something to take on an as-needed basis.  ROS: See pertinent positives and negatives per HPI.  Past Medical History:  Diagnosis Date   Allergy    Anxiety    Graves disease 05/07/2017   Headache    Heart murmur    Hypertension    Hyperthyroidism    Irritability     No past surgical history on file.  Family History  Problem Relation Age of Onset   Hyperlipidemia Father    Hypertension Father    Alcoholism Maternal Uncle    Prostate cancer Paternal Uncle    Mental illness Maternal Uncle    Thyroid disease Neg Hx     SOCIAL HX: Smoker   Current Outpatient Medications:    amLODipine (NORVASC) 10 MG tablet, Take 1 tablet (10 mg total) by mouth daily. Must keep follow-up for future refills., Disp: 90 tablet, Rfl: 0   hydrOXYzine (ATARAX/VISTARIL) 25 MG tablet, Take 1 tablet (25 mg total) by mouth every 8 (eight) hours as needed for anxiety., Disp: 30 tablet, Rfl: 0   metFORMIN (GLUCOPHAGE) 500 MG tablet, TAKE 1 TABLET (500 MG TOTAL) BY MOUTH 2 (TWO) TIMES DAILY WITH A MEAL., Disp: 180 tablet, Rfl: 1   Multiple  Vitamin (MULTI-VITAMINS) TABS, Take by mouth., Disp: , Rfl:    sertraline (ZOLOFT) 100 MG tablet, TAKE 2 TABLETS BY MOUTH DAILY. NEEDS APPT FOR MORE REFILLS., Disp: 180 tablet, Rfl: 0   levothyroxine (SYNTHROID, LEVOTHROID) 112 MCG tablet, Take 224 mcg by mouth daily., Disp: , Rfl:   EXAM:  VITALS per patient if applicable:  GENERAL: alert, oriented, appears well and in no acute distress  HEENT: atraumatic, conjunttiva clear, no obvious abnormalities on inspection of external nose and ears  NECK: normal movements of the head  and neck  LUNGS: on inspection no signs of respiratory distress, breathing rate appears normal, no obvious gross SOB, gasping or wheezing  CV: no obvious cyanosis  MS: moves all visible extremities without noticeable abnormality  PSYCH/NEURO: pleasant and cooperative, no obvious depression or anxiety, speech and thought processing grossly intact  ASSESSMENT AND PLAN:  Discussed the following assessment and plan:  Problem List Items Addressed This Visit     Anxiety    This continues to be an occasional issue.  He will continue Zoloft 200 mg once daily.  We will trial hydroxyzine as needed.  Counseled on risk of drowsiness with this.      Relevant Medications   hydrOXYzine (ATARAX/VISTARIL) 25 MG tablet   Hyperlipidemia    Check lipid panel.      Relevant Orders   Lipid panel   Comp Met (CMET)   Hypertension    Undetermined control.  He will come in for nurse BP check.  He will continue amlodipine 10 mg once daily.      Relevant Orders   Lipid panel   Comp Met (CMET)   Postablative hypothyroidism    Most recent TSH elevated through endocrinology though it appears he had been off of this medicine for a while.  He will continue to follow with endocrinology for this.      Type 2 diabetes mellitus (HCC)    Continue metformin 500 mg twice daily.  He will come in for labs.      Relevant Orders   HgB A1c    The patient was advised to contact us if nobody from the office reaches out to him in the next 3 to 4 days regarding scheduling his labs and nurse blood pressure check.  Return in about 1 week (around 09/08/2021) for Labs and nurse BP check, patient will keep his physical appointment for next month.   I discussed the assessment and treatment plan with the patient. The patient was provided an opportunity to ask questions and all were answered. The patient agreed with the plan and demonstrated an understanding of the instructions.   The patient was advised to call back  or seek an in-person evaluation if the symptoms worsen or if the condition fails to improve as anticipated.    [name], MD

## 2021-09-01 NOTE — Assessment & Plan Note (Signed)
Undetermined control.  He will come in for nurse BP check.  He will continue amlodipine 10 mg once daily.

## 2021-09-01 NOTE — Assessment & Plan Note (Signed)
Most recent TSH elevated through endocrinology though it appears he had been off of this medicine for a while.  He will continue to follow with endocrinology for this.

## 2021-09-01 NOTE — Assessment & Plan Note (Signed)
Check lipid panel  

## 2021-09-01 NOTE — Assessment & Plan Note (Signed)
This continues to be an occasional issue.  He will continue Zoloft 200 mg once daily.  We will trial hydroxyzine as needed.  Counseled on risk of drowsiness with this.

## 2021-09-01 NOTE — Assessment & Plan Note (Signed)
Continue metformin 500 mg twice daily.  He will come in for labs.

## 2021-09-04 ENCOUNTER — Encounter: Payer: Self-pay | Admitting: Family Medicine

## 2021-09-08 MED ORDER — BUSPIRONE HCL 7.5 MG PO TABS: 7.5000 mg | ORAL_TABLET | Freq: Two times a day (BID) | ORAL | 3 refills | Status: DC

## 2021-09-18 ENCOUNTER — Other Ambulatory Visit: Payer: Self-pay | Admitting: Family Medicine

## 2021-09-21 MED ORDER — SERTRALINE HCL 100 MG PO TABS: ORAL_TABLET | ORAL | 0 refills | Status: DC

## 2021-09-23 ENCOUNTER — Other Ambulatory Visit: Payer: Self-pay | Admitting: Family Medicine

## 2021-09-29 ENCOUNTER — Other Ambulatory Visit: Payer: Self-pay | Admitting: Family Medicine

## 2021-09-29 DIAGNOSIS — R7309 Other abnormal glucose: Secondary | ICD-10-CM

## 2021-10-14 ENCOUNTER — Encounter: Payer: Self-pay | Admitting: Family Medicine

## 2021-11-09 ENCOUNTER — Encounter: Payer: Self-pay | Admitting: Family Medicine

## 2021-11-09 ENCOUNTER — Telehealth: Payer: Self-pay | Admitting: Family Medicine

## 2021-11-09 NOTE — Telephone Encounter (Signed)
I called and LVM for the patient to call back and schedule his physical and lab before he can get any refills.  Larisa Lanius,cma

## 2021-11-09 NOTE — Telephone Encounter (Signed)
Please call the patient. He missed his physical exam today. He needs to have lab work drawn and to have his appointment rescheduled. He needs to have these labs drawn and to complete the in office visit to be able to get refills of his medications refilled moving forward. Thanks.

## 2021-11-10 NOTE — Telephone Encounter (Signed)
Patient was called and he was rescheduled for his physical and labs before in March.  Edward Hurley,cma

## 2021-11-17 ENCOUNTER — Other Ambulatory Visit: Payer: Self-pay | Admitting: Family Medicine

## 2021-12-03 DIAGNOSIS — G4733 Obstructive sleep apnea (adult) (pediatric): Secondary | ICD-10-CM | POA: Diagnosis not present

## 2021-12-24 ENCOUNTER — Other Ambulatory Visit: Payer: Self-pay | Admitting: Family Medicine

## 2021-12-26 DIAGNOSIS — G4733 Obstructive sleep apnea (adult) (pediatric): Secondary | ICD-10-CM | POA: Diagnosis not present

## 2022-01-03 DIAGNOSIS — G4733 Obstructive sleep apnea (adult) (pediatric): Secondary | ICD-10-CM | POA: Diagnosis not present

## 2022-01-23 DIAGNOSIS — G4733 Obstructive sleep apnea (adult) (pediatric): Secondary | ICD-10-CM | POA: Diagnosis not present

## 2022-01-31 DIAGNOSIS — G4733 Obstructive sleep apnea (adult) (pediatric): Secondary | ICD-10-CM | POA: Diagnosis not present

## 2022-02-23 DIAGNOSIS — G4733 Obstructive sleep apnea (adult) (pediatric): Secondary | ICD-10-CM | POA: Diagnosis not present

## 2022-03-03 DIAGNOSIS — G4733 Obstructive sleep apnea (adult) (pediatric): Secondary | ICD-10-CM | POA: Diagnosis not present

## 2022-03-08 DIAGNOSIS — E890 Postprocedural hypothyroidism: Secondary | ICD-10-CM | POA: Diagnosis not present

## 2022-03-10 ENCOUNTER — Other Ambulatory Visit: Payer: Self-pay | Admitting: Family Medicine

## 2022-03-10 DIAGNOSIS — I10 Essential (primary) hypertension: Secondary | ICD-10-CM

## 2022-03-10 DIAGNOSIS — E890 Postprocedural hypothyroidism: Secondary | ICD-10-CM | POA: Diagnosis not present

## 2022-03-11 ENCOUNTER — Other Ambulatory Visit: Payer: Self-pay | Admitting: Family Medicine

## 2022-03-25 DIAGNOSIS — G4733 Obstructive sleep apnea (adult) (pediatric): Secondary | ICD-10-CM | POA: Diagnosis not present

## 2022-03-26 ENCOUNTER — Other Ambulatory Visit: Payer: Self-pay | Admitting: Family Medicine

## 2022-03-26 DIAGNOSIS — I10 Essential (primary) hypertension: Secondary | ICD-10-CM

## 2022-04-02 DIAGNOSIS — G4733 Obstructive sleep apnea (adult) (pediatric): Secondary | ICD-10-CM | POA: Diagnosis not present

## 2022-04-25 DIAGNOSIS — G4733 Obstructive sleep apnea (adult) (pediatric): Secondary | ICD-10-CM | POA: Diagnosis not present

## 2022-05-03 DIAGNOSIS — G4733 Obstructive sleep apnea (adult) (pediatric): Secondary | ICD-10-CM | POA: Diagnosis not present

## 2022-05-13 ENCOUNTER — Other Ambulatory Visit: Payer: Self-pay | Admitting: Family Medicine

## 2022-05-13 ENCOUNTER — Encounter: Payer: Self-pay | Admitting: Family Medicine

## 2022-05-13 ENCOUNTER — Ambulatory Visit (INDEPENDENT_AMBULATORY_CARE_PROVIDER_SITE_OTHER): Payer: 59 | Admitting: Family Medicine

## 2022-05-13 VITALS — BP 116/82 | HR 84 | Temp 98.7°F | Resp 16 | Ht 72.0 in | Wt 252.7 lb

## 2022-05-13 DIAGNOSIS — E1159 Type 2 diabetes mellitus with other circulatory complications: Secondary | ICD-10-CM

## 2022-05-13 DIAGNOSIS — E6609 Other obesity due to excess calories: Secondary | ICD-10-CM | POA: Diagnosis not present

## 2022-05-13 DIAGNOSIS — Z1159 Encounter for screening for other viral diseases: Secondary | ICD-10-CM

## 2022-05-13 DIAGNOSIS — E119 Type 2 diabetes mellitus without complications: Secondary | ICD-10-CM | POA: Diagnosis not present

## 2022-05-13 DIAGNOSIS — Z6834 Body mass index (BMI) 34.0-34.9, adult: Secondary | ICD-10-CM

## 2022-05-13 DIAGNOSIS — R69 Illness, unspecified: Secondary | ICD-10-CM | POA: Diagnosis not present

## 2022-05-13 DIAGNOSIS — Z23 Encounter for immunization: Secondary | ICD-10-CM

## 2022-05-13 DIAGNOSIS — E669 Obesity, unspecified: Secondary | ICD-10-CM | POA: Diagnosis not present

## 2022-05-13 DIAGNOSIS — F411 Generalized anxiety disorder: Secondary | ICD-10-CM

## 2022-05-13 DIAGNOSIS — I152 Hypertension secondary to endocrine disorders: Secondary | ICD-10-CM | POA: Diagnosis not present

## 2022-05-13 DIAGNOSIS — K429 Umbilical hernia without obstruction or gangrene: Secondary | ICD-10-CM | POA: Diagnosis not present

## 2022-05-13 DIAGNOSIS — Z114 Encounter for screening for human immunodeficiency virus [HIV]: Secondary | ICD-10-CM

## 2022-05-13 DIAGNOSIS — E890 Postprocedural hypothyroidism: Secondary | ICD-10-CM

## 2022-05-13 DIAGNOSIS — E1169 Type 2 diabetes mellitus with other specified complication: Secondary | ICD-10-CM | POA: Diagnosis not present

## 2022-05-13 DIAGNOSIS — E785 Hyperlipidemia, unspecified: Secondary | ICD-10-CM

## 2022-05-13 MED ORDER — OZEMPIC (0.25 OR 0.5 MG/DOSE) 2 MG/3ML ~~LOC~~ SOPN: 0.2500 mg | PEN_INJECTOR | SUBCUTANEOUS | 5 refills | Status: DC

## 2022-05-13 MED ORDER — CLONAZEPAM 0.5 MG PO TABS: 0.5000 mg | ORAL_TABLET | Freq: Two times a day (BID) | ORAL | 3 refills | Status: DC | PRN

## 2022-05-13 NOTE — Assessment & Plan Note (Signed)
Was previously seen by Dr. Lady Gary with GEN surg, but he was unable to take the time off to have it repaired at that time He may be in a better place to have a repair done at this time, so new referral will be placed to general surgery

## 2022-05-13 NOTE — Assessment & Plan Note (Signed)
Reviewed last lipid panel Not currently on a statin Recheck FLP and CMP Discussed diet and exercise  

## 2022-05-13 NOTE — Assessment & Plan Note (Signed)
Chronic and fairly well controlled at baseline, but still having occasional spikes He will continue Zoloft 200 mg daily He has failed hydroxyzine and BuSpar previously Discussed risks with benzo use We will give 15 pills/month of low-dose clonazepam to use as needed in a sparing manner

## 2022-05-13 NOTE — Progress Notes (Signed)
I,Sulibeya S Dimas,acting as a scribe for Shirlee Latch, MD.,have documented all relevant documentation on the behalf of Shirlee Latch, MD,as directed by  Shirlee Latch, MD while in the presence of Shirlee Latch, MD.  New patient visit   Patient: Edward Hurley   DOB: 01/31/1976   46 y.o. Male  MRN: [id number] Visit Date: 05/13/2022  Today's healthcare provider: Shirlee Latch, MD   Chief Complaint  Patient presents with   Annual Exam   New Patient (Initial Visit)   Diabetes   Hypertension   Subjective    Edward Hurley is a 46 y.o. male who presents today as a new patient to establish care.  HPI  Diabetes Mellitus Type II, follow-up  Lab Results  Component Value Date   HGBA1C 7.0 (H) 03/06/2020   HGBA1C 5.2 04/20/2018   HGBA1C 6.0 05/23/2017   Last seen for diabetes 1 years ago.  Management since then includes continuing the same treatment. He reports poor compliance with treatment. He is having side effects. GI side effects. Patient has been off metformin since January, 2023.  Home blood sugar records:  not being checked   Current insulin regiment: none Most Recent Eye Exam: the [facility] [employer]  A lot more sedentary job.  --------------------------------------------------------------------------------------------------- Hypertension, follow-up  BP Readings from Last 3 Encounters:  05/13/22 116/82  06/11/21 (!) 170/125  04/07/20 (!) 138/100   Wt Readings from Last 3 Encounters:  05/13/22 252 lb 11.2 oz (114.6 kg)  09/01/21 257 lb (116.6 kg)  06/11/21 254 lb (115.2 kg)     He was last seen for hypertension 1 months ago.  BP at that visit was 170/125. Management since that visit includes no changes. He reports excellent compliance with treatment. He is not having side effects.  He is not exercising. He is not adherent to low salt diet.   Outside blood pressures are not being checked.  He does  smoke.  Use of agents associated with hypertension: none.   ---------------------------------------------------------------------------------------------------  Anxiety - has been well controlled with Zoloft 200mg  daily for many years.  Has a stressful job with a lot of meetings with other people.  Does do theater as well.  Had been on Clonazepam prn in the past (last Rx'd several years ago) for situational anxiety.  Buspar seemed to do nothing for his anxiety. Hydroxyzine caused dizziness.  H/o Grave's disease s/p ablation - now with hypothyroidism - stable on levothyroxine. F/b Dr at [facility].  Past Medical History:  Diagnosis Date   Allergy    Anxiety    Diabetes (HCC)    Graves disease 05/07/2017   Headache    Heart murmur    Hypertension    Hyperthyroidism    Irritability    Umbilical hernia    Past Surgical History:  Procedure Laterality Date   NO PAST SURGERIES     Family Status  Relation Name Status   Mother  Alive   Father  Alive   Mat Uncle  (Not Specified)   Mat Uncle  (Not Specified)   05/09/2017  (Not Specified)   Neg Hx  (Not Specified)   Family History  Problem Relation Age of Onset   Anxiety disorder Mother    Hyperlipidemia Father    Hypertension Father    Alcoholism Maternal Uncle    Mental illness Maternal Uncle    Prostate cancer Paternal Uncle    Thyroid disease Neg Hx    Breast cancer Neg Hx  Colon cancer Neg Hx    Social History   Socioeconomic History   Marital status: Married    Spouse name: Morrie Sheldon   Number of children: 2   Years of education: Not on file   Highest education level: Not on file  Occupational History   Occupation: childcare  Tobacco Use   Smoking status: Light Smoker    Types: Cigarettes   Smokeless tobacco: Never   Tobacco comments:    1-2 cig per day, on weekend 4-5 cig  Vaping Use   Vaping Use: Never used  Substance and Sexual Activity   Alcohol use: Yes    Comment: socially, not regularly   Drug  use: No   Sexual activity: Yes    Partners: Female    Comment: wife with Depo shots  Other Topics Concern   Not on file  Social History Narrative   Not on file   Social Determinants of Health   Financial Resource Strain: Not on file  Food Insecurity: Not on file  Transportation Needs: Not on file  Physical Activity: Not on file  Stress: Not on file  Social Connections: Not on file   Outpatient Medications Prior to Visit  Medication Sig   amLODipine (NORVASC) 10 MG tablet TAKE 1 TABLET (10 MG TOTAL) BY MOUTH DAILY. MUST KEEP FOLLOW-UP FOR FUTURE REFILLS.   levothyroxine (SYNTHROID, LEVOTHROID) 112 MCG tablet Take 224 mcg by mouth daily.   Multiple Vitamin (MULTI-VITAMINS) TABS Take by mouth.   sertraline (ZOLOFT) 100 MG tablet TAKE 2 TABLETS BY MOUTH DAILY.   [DISCONTINUED] busPIRone (BUSPAR) 7.5 MG tablet TAKE 1 TABLET BY MOUTH TWICE DAILY   [DISCONTINUED] metFORMIN (GLUCOPHAGE) 500 MG tablet TAKE 1 TABLET TWICE DAILY WITH A MEAL. (Patient not taking: Reported on 05/13/2022)   No facility-administered medications prior to visit.   No Known Allergies  Immunization History  Administered Date(s) Administered   Influenza Inj Mdck Quad Pf 09/01/2019   Influenza,inj,Quad PF,6+ Mos 09/09/2018, 09/01/2019   Influenza-Unspecified 08/06/2014, 08/22/2014, 09/09/2018, 09/01/2019   Moderna Covid-19 Vaccine Bivalent Booster 17yrs & up 09/30/2020   Moderna Sars-Covid-2 Vaccination 01/24/2020, 02/21/2020   Td 01/15/2009   Tdap 01/15/2009, 05/13/2022    Health Maintenance  Topic Date Due   FOOT EXAM  Never done   OPHTHALMOLOGY EXAM  Never done   URINE MICROALBUMIN  Never done   Hepatitis C Screening  Never done   HEMOGLOBIN A1C  09/05/2020   INFLUENZA VACCINE  06/22/2022   TETANUS/TDAP  05/13/2032   COVID-19 Vaccine  Completed   HIV Screening  Completed   HPV VACCINES  Aged Out    Patient Care Team: Amarylis Rovito, [name], MD as PCP - General (Family Medicine)  Review of  Systems  Constitutional:  Positive for fatigue.  Gastrointestinal:  Positive for anal bleeding and nausea.  Endocrine: Positive for polydipsia and polyuria.  Musculoskeletal:  Positive for joint swelling and neck pain.  Allergic/Immunologic: Positive for environmental allergies.  Neurological:  Positive for light-headedness.  Psychiatric/Behavioral:  Positive for decreased concentration and sleep disturbance. The patient is nervous/anxious.   All other systems reviewed and are negative.   Last CBC Lab Results  Component Value Date   WBC 8.6 03/06/2020   HGB 14.5 03/06/2020   HCT 40.8 03/06/2020   MCV 94.4 03/06/2020   MCH 31.2 05/06/2017   RDW 13.3 03/06/2020   PLT 230.0 03/06/2020   Last metabolic panel Lab Results  Component Value Date   GLUCOSE 136 (H) 03/31/2020   NA 140  03/06/2020   K 4.1 03/06/2020   CL 103 03/06/2020   CO2 29 03/06/2020   BUN 15 03/06/2020   CREATININE 1.00 03/06/2020   CALCIUM 9.4 03/06/2020   PROT 7.3 03/06/2020   ALBUMIN 4.8 03/06/2020   BILITOT 0.4 03/06/2020   ALKPHOS 59 03/06/2020   AST 25 03/06/2020   ALT 40 03/06/2020   Last lipids Lab Results  Component Value Date   CHOL 232 (H) 03/06/2020   HDL 41.40 03/06/2020   LDLCALC 159 (H) 03/06/2020   TRIG 160.0 (H) 03/06/2020   CHOLHDL 6 03/06/2020   Last hemoglobin A1c Lab Results  Component Value Date   HGBA1C 7.0 (H) 03/06/2020   Last thyroid functions Lab Results  Component Value Date   TSH 3.85 03/31/2020   T3TOTAL 268 (H) 01/30/2015   T4TOTAL 11.8 12/18/2015       Objective    BP 116/82 (BP Location: Left Arm, Patient Position: Sitting, Cuff Size: Large)   Pulse 84   Temp 98.7 F (37.1 C) (Oral)   Resp 16   Ht 6' (1.829 m)   Wt 252 lb 11.2 oz (114.6 kg)   SpO2 98%   BMI 34.27 kg/m  BP Readings from Last 3 Encounters:  05/13/22 116/82  06/11/21 (!) 170/125  04/07/20 (!) 138/100   Wt Readings from Last 3 Encounters:  05/13/22 252 lb 11.2 oz (114.6 kg)   09/01/21 257 lb (116.6 kg)  06/11/21 254 lb (115.2 kg)      Physical Exam Vitals reviewed.  Constitutional:      General: He is not in acute distress.    Appearance: Normal appearance. He is well-developed. He is not diaphoretic.  HENT:     Head: Normocephalic and atraumatic.     Right Ear: External ear normal.     Left Ear: External ear normal.     Nose: Nose normal.     Mouth/Throat:     Mouth: Mucous membranes are moist.     Pharynx: Oropharynx is clear. No oropharyngeal exudate.  Eyes:     General: No scleral icterus.    Conjunctiva/sclera: Conjunctivae normal.     Pupils: Pupils are equal, round, and reactive to light.  Neck:     Thyroid: No thyromegaly.  Cardiovascular:     Rate and Rhythm: Normal rate and regular rhythm.     Pulses: Normal pulses.     Heart sounds: Normal heart sounds. No murmur heard. Pulmonary:     Effort: Pulmonary effort is normal. No respiratory distress.     Breath sounds: Normal breath sounds. No wheezing or rales.  Abdominal:     General: There is no distension.     Palpations: Abdomen is soft.     Tenderness: There is no abdominal tenderness.  Musculoskeletal:        General: No deformity.     Cervical back: Neck supple.     Right lower leg: No edema.     Left lower leg: No edema.  Lymphadenopathy:     Cervical: No cervical adenopathy.  Skin:    General: Skin is warm and dry.     Findings: No rash.  Neurological:     Mental Status: He is alert and oriented to person, place, and time. Mental status is at baseline.     Gait: Gait normal.  Psychiatric:        Mood and Affect: Mood normal.        Behavior: Behavior normal.  Thought Content: Thought content normal.      Depression Screen    05/13/2022    3:28 PM 09/01/2021   10:07 AM 03/06/2020   10:11 AM 06/29/2017    2:23 PM  PHQ 2/9 Scores  PHQ - 2 Score 2 0 4 3  PHQ- 9 Score 16  16 12    No results found for any visits on 05/13/22.  Assessment & Plan       Problem List Items Addressed This Visit       Cardiovascular and Mediastinum   Hypertension associated with diabetes (HCC)    Well controlled Continue current medications Recheck metabolic panel F/u in 6 months      Relevant Medications   Semaglutide,0.25 or 0.5MG /DOS, (OZEMPIC, 0.25 OR 0.5 MG/DOSE,) 2 MG/3ML SOPN   Other Relevant Orders   Comprehensive metabolic panel     Endocrine   Postablative hypothyroidism    Followed by endocrinology Most recent TSH within normal limits Continue current dose of Synthroid May consider taking over monitoring from endocrinology in the future      Type 2 diabetes mellitus (HCC) - Primary    Was newly diagnosed at his last visit with previous PCP in 2021 Last A1c borderline at 7.0 Was previously treated with metformin, but did not tolerate due to GI side effects Start Ozempic 0.25 mg weekly Discussed possible side effects and benefits Discussed the difference between prediabetes and diabetes with the patient Discussed importance of screenings with patient We will send ROI for eye exam Foot exam at next visit UACR today Recheck A1c Not currently on ACE/ARB or statin      Relevant Medications   Semaglutide,0.25 or 0.5MG /DOS, (OZEMPIC, 0.25 OR 0.5 MG/DOSE,) 2 MG/3ML SOPN   Other Relevant Orders   Hemoglobin A1c   Microalbumin / creatinine urine ratio   Hyperlipidemia associated with type 2 diabetes mellitus (HCC)    Reviewed last lipid panel Not currently on a statin Recheck FLP and CMP Discussed diet and exercise       Relevant Medications   Semaglutide,0.25 or 0.5MG /DOS, (OZEMPIC, 0.25 OR 0.5 MG/DOSE,) 2 MG/3ML SOPN   Other Relevant Orders   Comprehensive metabolic panel   Lipid Panel With LDL/HDL Ratio     Other   Obesity    Discussed importance of healthy weight management Discussed diet and exercise  Starting GLP 1 as above      Relevant Medications   Semaglutide,0.25 or 0.5MG /DOS, (OZEMPIC, 0.25 OR 0.5  MG/DOSE,) 2 MG/3ML SOPN   Umbilical hernia without obstruction and without gangrene    Was previously seen by Dr. Lady Garyannon with GEN surg, but he was unable to take the time off to have it repaired at that time He may be in a better place to have a repair done at this time, so new referral will be placed to general surgery      Relevant Orders   Ambulatory referral to General Surgery   GAD (generalized anxiety disorder)    Chronic and fairly well controlled at baseline, but still having occasional spikes He will continue Zoloft 200 mg daily He has failed hydroxyzine and BuSpar previously Discussed risks with benzo use We will give 15 pills/month of low-dose clonazepam to use as needed in a sparing manner      Other Visit Diagnoses     Need for hepatitis C screening test       Relevant Orders   Hepatitis C Antibody   Encounter for screening for HIV  Relevant Orders   HIV antibody (with reflex)   Need for Tdap vaccination       Relevant Orders   Tdap vaccine greater than or equal to 7yo IM (Completed)        Return in about 4 months (around 09/12/2022) for CPE.     Total time spent on today's visit was greater than 60 minutes, including both face-to-face time and nonface-to-face time personally spent on review of chart (labs and imaging), discussing labs and goals, discussing further work-up, treatment options, referrals to specialist, reviewing outside records, answering patient's questions, and coordinating care.    I, Shirlee Latch, MD, have reviewed all documentation for this visit. The documentation on 05/13/22 for the exam, diagnosis, procedures, and orders are all accurate and complete.   Matha Masse, [name], MD, MPH [facility]

## 2022-05-13 NOTE — Assessment & Plan Note (Addendum)
Was newly diagnosed at his last visit with previous PCP in 2021 Last A1c borderline at 7.0 Was previously treated with metformin, but did not tolerate due to GI side effects Start Ozempic 0.25 mg weekly Discussed possible side effects and benefits Discussed the difference between prediabetes and diabetes with the patient Discussed importance of screenings with patient We will send ROI for eye exam Foot exam at next visit UACR today Recheck A1c Not currently on ACE/ARB or statin

## 2022-05-13 NOTE — Assessment & Plan Note (Signed)
Discussed importance of healthy weight management Discussed diet and exercise  Starting GLP 1 as above

## 2022-05-13 NOTE — Assessment & Plan Note (Signed)
Well controlled Continue current medications Recheck metabolic panel F/u in 6 months  

## 2022-05-17 ENCOUNTER — Encounter: Payer: Self-pay | Admitting: Family Medicine

## 2022-05-17 ENCOUNTER — Other Ambulatory Visit: Payer: Self-pay | Admitting: Family Medicine

## 2022-05-19 NOTE — Telephone Encounter (Signed)
I have created a PA request since patient was intolerance to Metformin. Hoping that helps with determination from insurance.

## 2022-05-24 ENCOUNTER — Other Ambulatory Visit: Payer: Self-pay

## 2022-05-24 MED ORDER — TRULICITY 0.75 MG/0.5ML ~~LOC~~ SOAJ: 0.7500 mg | SUBCUTANEOUS | 3 refills | Status: DC

## 2022-05-24 NOTE — Telephone Encounter (Signed)
Ok let's do Trulicity 0.75mg  weekly instead of Ozempic

## 2022-05-25 DIAGNOSIS — G4733 Obstructive sleep apnea (adult) (pediatric): Secondary | ICD-10-CM | POA: Diagnosis not present

## 2022-06-01 DIAGNOSIS — G4733 Obstructive sleep apnea (adult) (pediatric): Secondary | ICD-10-CM | POA: Diagnosis not present

## 2022-06-03 ENCOUNTER — Other Ambulatory Visit: Payer: Self-pay | Admitting: Family Medicine

## 2022-06-03 MED ORDER — SERTRALINE HCL 100 MG PO TABS
200.0000 mg | ORAL_TABLET | Freq: Every day | ORAL | 0 refills | Status: DC
Start: 2022-06-03 — End: 2022-08-09

## 2022-06-25 DIAGNOSIS — G4733 Obstructive sleep apnea (adult) (pediatric): Secondary | ICD-10-CM | POA: Diagnosis not present

## 2022-07-02 DIAGNOSIS — G4733 Obstructive sleep apnea (adult) (pediatric): Secondary | ICD-10-CM | POA: Diagnosis not present

## 2022-07-26 DIAGNOSIS — G4733 Obstructive sleep apnea (adult) (pediatric): Secondary | ICD-10-CM | POA: Diagnosis not present

## 2022-07-28 ENCOUNTER — Encounter: Payer: Self-pay | Admitting: Family Medicine

## 2022-08-02 DIAGNOSIS — G4733 Obstructive sleep apnea (adult) (pediatric): Secondary | ICD-10-CM | POA: Diagnosis not present

## 2022-08-08 ENCOUNTER — Other Ambulatory Visit: Payer: Self-pay | Admitting: Family Medicine

## 2022-08-13 ENCOUNTER — Encounter: Payer: Self-pay | Admitting: Family Medicine

## 2022-08-13 ENCOUNTER — Telehealth (INDEPENDENT_AMBULATORY_CARE_PROVIDER_SITE_OTHER): Payer: 59 | Admitting: Family Medicine

## 2022-08-13 VITALS — Ht 72.0 in | Wt 246.0 lb

## 2022-08-13 DIAGNOSIS — F411 Generalized anxiety disorder: Secondary | ICD-10-CM

## 2022-08-13 DIAGNOSIS — E119 Type 2 diabetes mellitus without complications: Secondary | ICD-10-CM

## 2022-08-13 DIAGNOSIS — Z7985 Long-term (current) use of injectable non-insulin antidiabetic drugs: Secondary | ICD-10-CM | POA: Diagnosis not present

## 2022-08-13 DIAGNOSIS — R69 Illness, unspecified: Secondary | ICD-10-CM | POA: Diagnosis not present

## 2022-08-13 MED ORDER — CLONAZEPAM 0.5 MG PO TABS: 0.5000 mg | ORAL_TABLET | Freq: Two times a day (BID) | ORAL | 1 refills | Status: DC | PRN

## 2022-08-13 MED ORDER — TRULICITY 1.5 MG/0.5ML ~~LOC~~ SOAJ: 1.5000 mg | SUBCUTANEOUS | 2 refills | Status: DC

## 2022-08-13 NOTE — Assessment & Plan Note (Signed)
Chronic, stable  Will increase trulicity dose to 1.5mg  weekly  Patient will follow up as scheduled in 1 month with PCP  Does not check BG at home  Discussed recommendations for more balanced diet and increased physical activity

## 2022-08-13 NOTE — Assessment & Plan Note (Signed)
Chronic, stable  Patient reports missing some doses of setraline occasionally  Uses klonopin 0.5mg  PRN once daily (does not typically take twice when he uses it) during the weekdays in settings of increased anxiety  Refills provided for klonopin 0.5 mg per patient request  Patient will continue Zoloft 100mg  daily

## 2022-08-13 NOTE — Progress Notes (Signed)
MyChart Video Visit    Virtual Visit via Video Note   This visit type was conducted due to national recommendations for restrictions regarding the COVID-19 Pandemic (e.g. social distancing) in an effort to limit this patient's exposure and mitigate transmission in our community. This patient is at least at moderate risk for complications without adequate follow up. This format is felt to be most appropriate for this patient at this time. Physical exam was limited by quality of the video and audio technology used for the visit.   Patient location: home address  Provider location: [facility]   I discussed the limitations of evaluation and management by telemedicine and the availability of in person appointments. The patient expressed understanding and agreed to proceed.  Patient: Edward Hurley   DOB: [birth year]   46 y.o. Male  MRN: [id number] Visit Date: 08/13/2022  Today's healthcare provider: Eulis Foster, MD   Chief Complaint  Patient presents with   Follow-up DM, Obesity, GAD   Subjective    HPI  Diabetes Mellitus Type II, Follow-up  Lab Results  Component Value Date   HGBA1C 7.0 (H) 03/06/2020   HGBA1C 5.2 04/20/2018   HGBA1C 6.0 05/23/2017   Wt Readings from Last 3 Encounters:  08/13/22 246 lb (111.6 kg)  05/13/22 252 lb 11.2 oz (114.6 kg)  09/01/21 257 lb (116.6 kg)   Last seen for diabetes 3 months ago.  Management since then includes Start Ozempic 0.25 mg weekly. Ozempic was denied by insurance. Trulicity was sent in. Patient is seeing provider today for different dose of Trulicity. Per patient in the beginning had a lot of great success and eased symptoms of diabetes  and feels like the current dose is not working any longer. He reports increased appetite and weight has been steady at 246lbs. He reports eating unbalanced diet and does not participate in physical activity regularly.   Wt Readings from Last 3 Encounters:   08/13/22 246 lb (111.6 kg)  05/13/22 252 lb 11.2 oz (114.6 kg)  09/01/21 257 lb (116.6 kg)    Anxiety, Follow-up  He was last seen for anxiety 3 months ago. Changes made at last visit include He will continue Zoloft 200 mg daily He has failed hydroxyzine and BuSpar previously Discussed risks with benzo use We will give 15 pills/month of low-dose clonazepam to use as needed in a sparing manner.   He reports excellent compliance with treatment. He reports excellent tolerance of treatment. He is not having side effects.   He feels his anxiety is mild and Improved since last visit.  Symptoms: No chest pain Yes difficulty concentrating  No dizziness Yes fatigue  No feelings of losing control Yes insomnia  Yes irritable No palpitations  No panic attacks No racing thoughts  No shortness of breath No sweating  No tremors/shakes    GAD-7 Results    08/13/2022    1:05 PM 03/06/2020   10:12 AM 06/29/2017    2:25 PM  GAD-7 Generalized Anxiety Disorder Screening Tool  1. Feeling Nervous, Anxious, or on Edge 2 2 1   2. Not Being Able to Stop or Control Worrying 3 2 2   3. Worrying Too Much About Different Things 2 2 1   4. Trouble Relaxing 2 1 1   5. Being So Restless it's Hard To Sit Still 1 2 1   6. Becoming Easily Annoyed or Irritable 2 2 2   7. Feeling Afraid As If Something Awful Might Happen 2 1 2   Total GAD-7 Score  14 12 10   Difficulty At Work, Home, or Getting  Along With Others? Somewhat difficult Somewhat difficult     PHQ-9 Scores    08/13/2022    1:07 PM 05/13/2022    3:28 PM 09/01/2021   10:07 AM  PHQ9 SCORE ONLY  PHQ-9 Total Score 18 16 0    ---------------------------------------------------------------------------------------------------   Medications: Outpatient Medications Prior to Visit  Medication Sig   amLODipine (NORVASC) 10 MG tablet TAKE 1 TABLET (10 MG TOTAL) BY MOUTH DAILY. MUST KEEP FOLLOW-UP FOR FUTURE REFILLS.   clonazePAM (KLONOPIN) 0.5 MG tablet  Take 1 tablet (0.5 mg total) by mouth 2 (two) times daily as needed for anxiety.   Dulaglutide (TRULICITY) 0.75 MG/0.5ML SOPN Inject 0.75 mg into the skin once a week.   Multiple Vitamin (MULTI-VITAMINS) TABS Take by mouth.   sertraline (ZOLOFT) 100 MG tablet TAKE 2 TABLETS BY MOUTH EVERY DAY   levothyroxine (SYNTHROID, LEVOTHROID) 112 MCG tablet Take 224 mcg by mouth daily.   No facility-administered medications prior to visit.    Review of Systems     Objective    Ht 6' (1.829 m)   Wt 246 lb (111.6 kg)   BMI 33.36 kg/m      Physical Exam Constitutional:      General: He is not in acute distress.    Appearance: Normal appearance. He is obese. He is not ill-appearing.  Pulmonary:     Effort: Pulmonary effort is normal.  Neurological:     Mental Status: He is alert.  Psychiatric:        Attention and Perception: Attention normal.        Mood and Affect: Mood normal. Mood is not anxious or elated. Affect is not blunt, flat, angry, tearful or inappropriate.        Speech: Speech normal.        Behavior: Behavior normal.        Assessment & Plan     Problem List Items Addressed This Visit       Endocrine   Type 2 diabetes mellitus (HCC)    Chronic, stable  Will increase trulicity dose to 1.5mg  weekly  Patient will follow up as scheduled in 1 month with PCP  Does not check BG at home  Discussed recommendations for more balanced diet and increased physical activity       Relevant Medications   Dulaglutide (TRULICITY) 1.5 MG/0.5ML SOPN     Other   GAD (generalized anxiety disorder) - Primary    Chronic, stable  Patient reports missing some doses of setraline occasionally  Uses klonopin 0.5mg  PRN once daily (does not typically take twice when he uses it) during the weekdays in settings of increased anxiety  Refills provided for klonopin 0.5 mg per patient request  Patient will continue Zoloft 100mg  daily         F/u as scheduled in Oct 2023    I  discussed the assessment and treatment plan with the patient. The patient was provided an opportunity to ask questions and all were answered. The patient agreed with the plan and demonstrated an understanding of the instructions.   The patient was advised to call back or seek an in-person evaluation if the symptoms worsen or if the condition fails to improve as anticipated.  I provided 13 minutes of non-face-to-face time during this encounter.  I, , MD, have reviewed all documentation for this visit. The documentation on 08/13/22 for the exam, diagnosis, procedures, and orders are all  accurate and complete.   Ronnald Ramp, MD [facility] [phone] (phone) [phone] (fax)  [facility]

## 2022-08-25 DIAGNOSIS — G4733 Obstructive sleep apnea (adult) (pediatric): Secondary | ICD-10-CM | POA: Diagnosis not present

## 2022-08-30 DIAGNOSIS — G4733 Obstructive sleep apnea (adult) (pediatric): Secondary | ICD-10-CM | POA: Diagnosis not present

## 2022-09-04 ENCOUNTER — Other Ambulatory Visit: Payer: Self-pay | Admitting: Family Medicine

## 2022-09-10 NOTE — Progress Notes (Signed)
I,Sulibeya S Dimas,acting as a scribe for Shirlee Latch, MD.,have documented all relevant documentation on the behalf of Shirlee Latch, MD,as directed by  Shirlee Latch, MD while in the presence of Shirlee Latch, MD.     Established patient visit   Patient: Edward Hurley   DOB: Sep 22, 1976   46 y.o. Male  MRN: [id number] Visit Date: 09/13/2022  Today's healthcare provider: Shirlee Latch, MD   Chief Complaint  Patient presents with   Diabetes   Anxiety   Subjective    HPI  Diabetes Mellitus Type II, follow-up  Lab Results  Component Value Date   HGBA1C 7.0 (A) 09/13/2022   HGBA1C 7.0 (H) 03/06/2020   HGBA1C 5.2 04/20/2018   Last seen for diabetes 1 months ago.  Management since then includes increase trulicity dose to 1.5mg  weekly. He reports excellent compliance with treatment. He is not having side effects.   Home blood sugar records:  not checked  Episodes of hypoglycemia? No    Current insulin regiment: none Most Recent Eye Exam:   --------------------------------------------------------------------------------------------------- Anxiety, Follow-up  He was last seen for anxiety 1 months ago. Changes made at last visit include refilled klonopin 0.5mg  per patient request. Continue Zoloft 100mg  daily. Patient reports taking klonopin 0.5mg  daily.    He reports excellent compliance with treatment. He reports excellent tolerance of treatment. He is not having side effects.   He feels his anxiety is moderate and Improved since last visit.  Symptoms: No chest pain No difficulty concentrating  No dizziness No fatigue  No feelings of losing control Yes insomnia  Yes irritable No palpitations  Yes panic attacks Yes racing thoughts  Yes shortness of breath No sweating  No tremors/shakes    GAD-7 Results    09/13/2022    4:11 PM 08/13/2022    1:05 PM 03/06/2020   10:12 AM  GAD-7 Generalized Anxiety Disorder Screening Tool  1.  Feeling Nervous, Anxious, or on Edge 1 2 2   2. Not Being Able to Stop or Control Worrying 1 3 2   3. Worrying Too Much About Different Things 1 2 2   4. Trouble Relaxing 1 2 1   5. Being So Restless it's Hard To Sit Still 0 1 2  6. Becoming Easily Annoyed or Irritable 1 2 2   7. Feeling Afraid As If Something Awful Might Happen 1 2 1   Total GAD-7 Score 6 14 12   Difficulty At Work, Home, or Getting  Along With Others? Somewhat difficult Somewhat difficult Somewhat difficult    PHQ-9 Scores    09/13/2022    4:09 PM 08/13/2022    1:07 PM 05/13/2022    3:28 PM  PHQ9 SCORE ONLY  PHQ-9 Total Score 10 18 16     ---------------------------------------------------------------------------------------------------    Medications: Outpatient Medications Prior to Visit  Medication Sig   amLODipine (NORVASC) 10 MG tablet TAKE 1 TABLET (10 MG TOTAL) BY MOUTH DAILY. MUST KEEP FOLLOW-UP FOR FUTURE REFILLS.   clonazePAM (KLONOPIN) 0.5 MG tablet Take 1 tablet (0.5 mg total) by mouth 2 (two) times daily as needed for anxiety.   Dulaglutide (TRULICITY) 1.5 MG/0.5ML SOPN Inject 1.5 mg into the skin once a week.   Multiple Vitamin (MULTI-VITAMINS) TABS Take by mouth.   sertraline (ZOLOFT) 100 MG tablet TAKE 2 TABLETS BY MOUTH EVERY DAY   levothyroxine (SYNTHROID, LEVOTHROID) 112 MCG tablet Take 224 mcg by mouth daily.   No facility-administered medications prior to visit.    Review of Systems  Eyes:  Positive for visual  disturbance.       Objective    BP 124/86 (BP Location: Left Arm, Patient Position: Sitting, Cuff Size: Large)   Pulse 87   Temp 98.6 F (37 C) (Oral)   Resp 16   Ht 6' (1.829 m)   Wt 251 lb (113.9 kg)   BMI 34.04 kg/m  BP Readings from Last 3 Encounters:  09/13/22 124/86  05/13/22 116/82  06/11/21 (!) 170/125   Wt Readings from Last 3 Encounters:  09/13/22 251 lb (113.9 kg)  08/13/22 246 lb (111.6 kg)  05/13/22 252 lb 11.2 oz (114.6 kg)      Physical Exam Vitals  reviewed.  Constitutional:      General: He is not in acute distress.    Appearance: Normal appearance. He is not diaphoretic.  HENT:     Head: Normocephalic and atraumatic.  Eyes:     General: No scleral icterus.    Conjunctiva/sclera: Conjunctivae normal.  Cardiovascular:     Rate and Rhythm: Normal rate and regular rhythm.     Pulses: Normal pulses.     Heart sounds: Normal heart sounds. No murmur heard. Pulmonary:     Effort: Pulmonary effort is normal. No respiratory distress.     Breath sounds: Normal breath sounds. No wheezing or rhonchi.  Musculoskeletal:     Cervical back: Neck supple.     Right lower leg: No edema.     Left lower leg: No edema.  Lymphadenopathy:     Cervical: No cervical adenopathy.  Skin:    General: Skin is warm and dry.     Findings: No rash.  Neurological:     Mental Status: He is alert and oriented to person, place, and time. Mental status is at baseline.  Psychiatric:        Mood and Affect: Mood normal.        Behavior: Behavior normal.       Results for orders placed or performed in visit on 09/13/22  POCT glycosylated hemoglobin (Hb A1C)  Result Value Ref Range   Hemoglobin A1C 7.0 (A) 4.0 - 5.6 %   Est. average glucose Bld gHb Est-mCnc 154     Assessment & Plan     Problem List Items Addressed This Visit       Cardiovascular and Mediastinum   Hypertension associated with diabetes (HCC)    Well controlled Continue current medications Recheck metabolic panel F/u in 6 months         Endocrine   Type 2 diabetes mellitus (HCC) - Primary    Chronic and stable Assoc with HTN and HLD Recheck a1c Does not check home CBGs Eating better       Relevant Orders   POCT glycosylated hemoglobin (Hb A1C) (Completed)   Hyperlipidemia associated with type 2 diabetes mellitus (HCC)    Reviewed last lipid panel Not currently on a statin Recheck FLP and CMP Discussed diet and exercise         Other   Class 1 obesity with  serious comorbidity and body mass index (BMI) of 34.0 to 34.9 in adult    Discussed importance of healthy weight management Discussed diet and exercise       GAD (generalized anxiety disorder)    Chronic and stable Continue current meds        Return in about 6 months (around 03/15/2023) for chronic disease f/u.      I, Shirlee Latch, MD, have reviewed all documentation for this visit. The documentation on  09/13/22 for the exam, diagnosis, procedures, and orders are all accurate and complete.   Letecia Arps, [name], MD, MPH [facility]

## 2022-09-13 ENCOUNTER — Encounter: Payer: Self-pay | Admitting: Family Medicine

## 2022-09-13 ENCOUNTER — Ambulatory Visit: Payer: 59 | Admitting: Family Medicine

## 2022-09-13 VITALS — BP 124/86 | HR 87 | Temp 98.6°F | Resp 16 | Ht 72.0 in | Wt 251.0 lb

## 2022-09-13 DIAGNOSIS — R69 Illness, unspecified: Secondary | ICD-10-CM | POA: Diagnosis not present

## 2022-09-13 DIAGNOSIS — I152 Hypertension secondary to endocrine disorders: Secondary | ICD-10-CM | POA: Diagnosis not present

## 2022-09-13 DIAGNOSIS — E669 Obesity, unspecified: Secondary | ICD-10-CM | POA: Diagnosis not present

## 2022-09-13 DIAGNOSIS — E1159 Type 2 diabetes mellitus with other circulatory complications: Secondary | ICD-10-CM | POA: Diagnosis not present

## 2022-09-13 DIAGNOSIS — Z6834 Body mass index (BMI) 34.0-34.9, adult: Secondary | ICD-10-CM

## 2022-09-13 DIAGNOSIS — E1169 Type 2 diabetes mellitus with other specified complication: Secondary | ICD-10-CM

## 2022-09-13 DIAGNOSIS — E119 Type 2 diabetes mellitus without complications: Secondary | ICD-10-CM

## 2022-09-13 DIAGNOSIS — Z114 Encounter for screening for human immunodeficiency virus [HIV]: Secondary | ICD-10-CM | POA: Diagnosis not present

## 2022-09-13 DIAGNOSIS — E785 Hyperlipidemia, unspecified: Secondary | ICD-10-CM | POA: Diagnosis not present

## 2022-09-13 DIAGNOSIS — F411 Generalized anxiety disorder: Secondary | ICD-10-CM

## 2022-09-13 DIAGNOSIS — Z1159 Encounter for screening for other viral diseases: Secondary | ICD-10-CM | POA: Diagnosis not present

## 2022-09-13 LAB — POCT GLYCOSYLATED HEMOGLOBIN (HGB A1C)
Est. average glucose Bld gHb Est-mCnc: 154
Hemoglobin A1C: 7 % — AB (ref 4.0–5.6)

## 2022-09-13 NOTE — Assessment & Plan Note (Signed)
Chronic and stable  Continue current meds

## 2022-09-13 NOTE — Assessment & Plan Note (Signed)
Well controlled Continue current medications Recheck metabolic panel F/u in 6 months  

## 2022-09-13 NOTE — Assessment & Plan Note (Signed)
Discussed importance of healthy weight management Discussed diet and exercise  

## 2022-09-13 NOTE — Assessment & Plan Note (Signed)
Reviewed last lipid panel Not currently on a statin Recheck FLP and CMP Discussed diet and exercise  

## 2022-09-13 NOTE — Assessment & Plan Note (Signed)
Chronic and stable Assoc with HTN and HLD Recheck a1c Does not check home CBGs Eating better

## 2022-09-15 LAB — LIPID PANEL WITH LDL/HDL RATIO
Cholesterol, Total: 202 mg/dL — ABNORMAL HIGH (ref 100–199)
HDL: 43 mg/dL (ref >39)
LDL Chol Calc (NIH): 136 mg/dL — ABNORMAL HIGH (ref 0–99)
LDL/HDL Ratio: 3.2 ratio (ref 0.0–3.6)
Triglycerides: 126 mg/dL (ref 0–149)
VLDL Cholesterol Cal: 23 mg/dL (ref 5–40)

## 2022-09-15 LAB — COMPREHENSIVE METABOLIC PANEL
ALT: 44 IU/L (ref 0–44)
AST: 26 IU/L (ref 0–40)
Albumin/Globulin Ratio: 2 (ref 1.2–2.2)
Albumin: 4.9 g/dL (ref 4.1–5.1)
Alkaline Phosphatase: 83 IU/L (ref 44–121)
BUN/Creatinine Ratio: 9 (ref 9–20)
BUN: 10 mg/dL (ref 6–24)
Bilirubin Total: 0.5 mg/dL (ref 0.0–1.2)
CO2: 19 mmol/L — ABNORMAL LOW (ref 20–29)
Calcium: 9.9 mg/dL (ref 8.7–10.2)
Chloride: 101 mmol/L (ref 96–106)
Creatinine, Ser: 1.07 mg/dL (ref 0.76–1.27)
Globulin, Total: 2.4 g/dL (ref 1.5–4.5)
Glucose: 141 mg/dL — ABNORMAL HIGH (ref 70–99)
Potassium: 3.9 mmol/L (ref 3.5–5.2)
Sodium: 141 mmol/L (ref 134–144)
Total Protein: 7.3 g/dL (ref 6.0–8.5)
eGFR: 88 mL/min/{1.73_m2} (ref >59)

## 2022-09-15 LAB — MICROALBUMIN / CREATININE URINE RATIO
Creatinine, Urine: 422.2 mg/dL
Microalb/Creat Ratio: 127 mg/g creat — ABNORMAL HIGH (ref 0–29)
Microalbumin, Urine: 535.5 ug/mL

## 2022-09-15 LAB — HEMOGLOBIN A1C
Est. average glucose Bld gHb Est-mCnc: 166 mg/dL
Hgb A1c MFr Bld: 7.4 % — ABNORMAL HIGH (ref 4.8–5.6)

## 2022-09-15 LAB — HIV ANTIBODY (ROUTINE TESTING W REFLEX): HIV Screen 4th Generation wRfx: NONREACTIVE

## 2022-09-15 LAB — HEPATITIS C ANTIBODY: Hep C Virus Ab: NONREACTIVE

## 2022-09-29 NOTE — Progress Notes (Unsigned)
   I,Edward Hurley,acting as a scribe for Shirlee Latch, MD.,have documented all relevant documentation on the behalf of Shirlee Latch, MD,as directed by  Shirlee Latch, MD while in the presence of Shirlee Latch, MD.     Established patient visit   Patient: Edward Hurley   DOB: 04-09-1976   46 y.o. Male  MRN: [id number] Visit Date: 09/30/2022  Today's healthcare provider: Shirlee Latch, MD   No chief complaint on file.  Subjective    HPI   Follow up for diabetes  The patient was last seen for this 2 weeks ago. Changes made at last visit include labs checked.  Urine shows diabetic kidney disease. A1c 7.4%.  Patient advised to schedule appt to discuss treatment options.  He reports {excellent/good/fair/poor:19665} compliance with treatment. Trulicity 1.5 mg weekly. He feels that condition is {improved/worse/unchanged:3041574}. He {is/is not:21021397} having side effects. ***  Lab Results  Component Value Date   HGBA1C 7.4 (H) 09/13/2022    ----------------------------------------------------------------------------------------- Follow up for high cholesterol   The patient was last seen for this 2 weeks ago. Changes made at last visit include order labs.  Cholesterol high, patient advised to come in to discuss medication options.   Lab Results  Component Value Date   CHOL 202 (H) 09/13/2022   HDL 43 09/13/2022   LDLCALC 136 (H) 09/13/2022   TRIG 126 09/13/2022   CHOLHDL 6 03/06/2020  -----------------------------------------------------------------------------------------   Medications: Outpatient Medications Prior to Visit  Medication Sig   amLODipine (NORVASC) 10 MG tablet TAKE 1 TABLET (10 MG TOTAL) BY MOUTH DAILY. MUST KEEP FOLLOW-UP FOR FUTURE REFILLS.   clonazePAM (KLONOPIN) 0.5 MG tablet Take 1 tablet (0.5 mg total) by mouth 2 (two) times daily as needed for anxiety.   Dulaglutide (TRULICITY) 1.5 MG/0.5ML SOPN Inject 1.5 mg  into the skin once a week.   levothyroxine (SYNTHROID, LEVOTHROID) 112 MCG tablet Take 224 mcg by mouth daily.   Multiple Vitamin (MULTI-VITAMINS) TABS Take by mouth.   sertraline (ZOLOFT) 100 MG tablet TAKE 2 TABLETS BY MOUTH EVERY DAY   No facility-administered medications prior to visit.    Review of Systems  {Labs  Heme  Chem  Endocrine  Serology  Results Review (optional):23779}   Objective    There were no vitals taken for this visit. {Show previous vital signs (optional):23777}  Physical Exam  ***  No results found for any visits on 09/30/22.  Assessment & Plan     ***  No follow-ups on file.      {provider attestation***:1}   Shirlee Latch, MD  [facility] [phone] (phone) [phone] (fax)  [facility]

## 2022-09-30 ENCOUNTER — Encounter: Payer: Self-pay | Admitting: Family Medicine

## 2022-09-30 ENCOUNTER — Ambulatory Visit: Payer: 59 | Admitting: Family Medicine

## 2022-09-30 VITALS — BP 139/87 | HR 43 | Temp 98.4°F | Wt 251.0 lb

## 2022-09-30 DIAGNOSIS — E1129 Type 2 diabetes mellitus with other diabetic kidney complication: Secondary | ICD-10-CM

## 2022-09-30 DIAGNOSIS — I152 Hypertension secondary to endocrine disorders: Secondary | ICD-10-CM

## 2022-09-30 DIAGNOSIS — E119 Type 2 diabetes mellitus without complications: Secondary | ICD-10-CM

## 2022-09-30 DIAGNOSIS — E785 Hyperlipidemia, unspecified: Secondary | ICD-10-CM

## 2022-09-30 DIAGNOSIS — G4733 Obstructive sleep apnea (adult) (pediatric): Secondary | ICD-10-CM | POA: Diagnosis not present

## 2022-09-30 DIAGNOSIS — R809 Proteinuria, unspecified: Secondary | ICD-10-CM | POA: Diagnosis not present

## 2022-09-30 DIAGNOSIS — E1169 Type 2 diabetes mellitus with other specified complication: Secondary | ICD-10-CM

## 2022-09-30 DIAGNOSIS — E1159 Type 2 diabetes mellitus with other circulatory complications: Secondary | ICD-10-CM

## 2022-09-30 MED ORDER — TRULICITY 3 MG/0.5ML ~~LOC~~ SOAJ: 3.0000 mg | SUBCUTANEOUS | 5 refills | Status: DC

## 2022-09-30 MED ORDER — ROSUVASTATIN CALCIUM 5 MG PO TABS: 5.0000 mg | ORAL_TABLET | Freq: Every day | ORAL | 3 refills | Status: DC

## 2022-09-30 MED ORDER — LOSARTAN POTASSIUM 100 MG PO TABS: 100.0000 mg | ORAL_TABLET | Freq: Every day | ORAL | 1 refills | Status: DC

## 2022-09-30 NOTE — Assessment & Plan Note (Signed)
Well controlled As we need to add ACE/ARB for microalbuminuria, will d/c amlodipine and start losartan 100 mg daily instead Recheck BMP at next visit

## 2022-09-30 NOTE — Patient Instructions (Signed)
  Diet Recommendations for Diabetes   Starchy (carb) foods include: Bread, rice, pasta, potatoes, corn, crackers, bagels, muffins, all baked goods.  (Fruits, milk, and yogurt also have carbohydrate, but most of these foods will not spike your blood sugar as the starchy foods will.)  A few fruits do cause high blood sugars; use small portions of bananas (limit to 1/2 at a time), grapes, watermelon, and most tropical fruits.    Protein foods include: Meat, fish, poultry, eggs, dairy foods, and beans such as pinto and kidney beans (beans also provide carbohydrate).   1. Limit starchy foods to TWO per meal and ONE per snack. ONE portion of a starchy  food is equal to the following:   - ONE slice of bread (or its equivalent, such as half of a hamburger bun).   - 1/2 cup of a "scoopable" starchy food such as potatoes or rice.   - 15 grams of carbohydrate as shown on food label.  2. Include at every meal: a protein food, a carb food, and vegetables and/or fruit.   - Obtain twice as many veg's as protein or carbohydrate foods for both lunch and dinner.   - Fresh or frozen veg's are best.   - Try to keep frozen veg's on hand for a quick vegetable serving.        

## 2022-09-30 NOTE — Assessment & Plan Note (Signed)
Reviewed last lipid panel - uncontrolled Discussed goal LDL <70 in setting of DM Not currently on a statin Start crestor 5mg  daily Recheck FLP and CMP at next visit in 57m Discussed diet and exercise

## 2022-09-30 NOTE — Assessment & Plan Note (Signed)
Chronic and uncotnrolled on recent A1c Increase trulicity to 3mg  weekly Repeat A1c at next visit

## 2022-09-30 NOTE — Assessment & Plan Note (Signed)
New problem Start losartan as above for kidney protection

## 2022-10-06 ENCOUNTER — Other Ambulatory Visit: Payer: Self-pay | Admitting: Family Medicine

## 2022-10-06 DIAGNOSIS — I10 Essential (primary) hypertension: Secondary | ICD-10-CM

## 2022-10-07 ENCOUNTER — Other Ambulatory Visit: Payer: Self-pay | Admitting: Family Medicine

## 2022-10-07 MED ORDER — CLONAZEPAM 0.5 MG PO TABS: 0.5000 mg | ORAL_TABLET | Freq: Two times a day (BID) | ORAL | 1 refills | Status: DC | PRN

## 2022-10-07 NOTE — Telephone Encounter (Signed)
Requested medication (s) are due for refill today - provider review   Requested medication (s) are on the active medication list -yes  Future visit scheduled -yes  Last refill: 08/13/22 #15 1RF  Notes to clinic: non delegated Rx Requested Prescriptions  Pending Prescriptions Disp Refills   clonazePAM (KLONOPIN) 0.5 MG tablet 15 tablet 1    Sig: Take 1 tablet (0.5 mg total) by mouth 2 (two) times daily as needed for anxiety.     Not Delegated - Psychiatry: Anxiolytics/Hypnotics 2 Failed - 10/07/2022 11:37 AM      Failed - This refill cannot be delegated      Failed - Urine Drug Screen completed in last 360 days      Passed - Patient is not pregnant      Passed - Valid encounter within last 6 months    Recent Outpatient Visits           1 week ago Type 2 diabetes mellitus without complication, without long-term current use of insulin (HCC)   [facility], [name], MD   3 weeks ago Type 2 diabetes mellitus without complication, without long-term current use of insulin (HCC)   [facility], [name], MD   1 month ago GAD (generalized anxiety disorder)   [facility], Financial controller, MD   4 months ago Type 2 diabetes mellitus without complication, without long-term current use of insulin (HCC)   [facility], [name], MD       Future Appointments             In 3 months Bacigalupo, [name], MD [name]               Requested Prescriptions  Pending Prescriptions Disp Refills   clonazePAM (KLONOPIN) 0.5 MG tablet 15 tablet 1    Sig: Take 1 tablet (0.5 mg total) by mouth 2 (two) times daily as needed for anxiety.     Not Delegated - Psychiatry: Anxiolytics/Hypnotics 2 Failed - 10/07/2022 11:37 AM      Failed - This refill cannot be delegated      Failed - Urine Drug Screen completed in last 360 days      Passed - Patient is not pregnant       Passed - Valid encounter within last 6 months    Recent Outpatient Visits           1 week ago Type 2 diabetes mellitus without complication, without long-term current use of insulin (HCC)   [facility], [name], MD   3 weeks ago Type 2 diabetes mellitus without complication, without long-term current use of insulin [facility])   [facility], [name], MD   1 month ago GAD (generalized anxiety disorder)   [facility], Financial controller, MD   4 months ago Type 2 diabetes mellitus without complication, without long-term current use of insulin [facility])   [facility] Bacigalupo, [name], MD       Future Appointments             In 3 months Bacigalupo, [name], MD [name]

## 2022-10-07 NOTE — Telephone Encounter (Signed)
Medication Refill - Medication: Klonopin 0.5 mg  Has the patient contacted their pharmacy? Yes.   (Agent: If no, request that the patient contact the pharmacy for the refill. If patient does not wish to contact the pharmacy document the reason why and proceed with request.) (Agent: If yes, when and what did the pharmacy advise?)  Preferred Pharmacy (with phone number or street name): [facility] Has the patient been seen for an appointment in the last year OR does the patient have an upcoming appointment? Yes.    Agent: Please be advised that RX refills may take up to 3 business days. We ask that you follow-up with your pharmacy.

## 2022-10-30 DIAGNOSIS — G4733 Obstructive sleep apnea (adult) (pediatric): Secondary | ICD-10-CM | POA: Diagnosis not present

## 2022-11-19 ENCOUNTER — Other Ambulatory Visit: Payer: Self-pay | Admitting: Family Medicine

## 2022-11-28 DIAGNOSIS — G4733 Obstructive sleep apnea (adult) (pediatric): Secondary | ICD-10-CM | POA: Diagnosis not present

## 2022-12-29 DIAGNOSIS — G4733 Obstructive sleep apnea (adult) (pediatric): Secondary | ICD-10-CM | POA: Diagnosis not present

## 2023-01-06 NOTE — Progress Notes (Signed)
I,Joseline E Rosas,acting as a scribe for Lavon Paganini, MD.,have documented all relevant documentation on the behalf of Lavon Paganini, MD,as directed by  Lavon Paganini, MD while in the presence of Lavon Paganini, MD.   Established patient visit   Patient: Edward Hurley   DOB: [birth year]   47 y.o. Male  MRN: [id number] Visit Date: 01/07/2023  Today's healthcare provider: Lavon Paganini, MD   Chief Complaint  Patient presents with   follow-up chronic disease   Subjective    HPI  Diabetes Mellitus Type II, follow-up  Lab Results  Component Value Date   HGBA1C 7.4 (H) 09/13/2022   HGBA1C 7.0 (A) 09/13/2022   HGBA1C 7.0 (H) 03/06/2020   Last seen for diabetes 3 months ago.  Management since then includes Increase trulicity to 55m weekly  He reports excellent compliance with treatment. He is not having side effects.   Home blood sugar records:  not being checked   Most Recent Eye Exam: 11/2021  --------------------------------------------------------------------------------------------------- Hypertension, follow-up  BP Readings from Last 3 Encounters:  01/07/23 (!) 143/103  09/30/22 139/87  09/13/22 124/86   Wt Readings from Last 3 Encounters:  01/07/23 245 lb 14.4 oz (111.5 kg)  09/30/22 251 lb (113.9 kg)  09/13/22 251 lb (113.9 kg)     He was last seen for hypertension 3 months ago.  BP at that visit was 139/87. Management since that visit includes d/c amlodipine and start losartan 100 mg daily instead . He reports excellent compliance with treatment. He is not having side effects.   Outside blood pressures are not being checked.  He does smoke some nights.   --------------------------------------------------------------------------------------------------- Lipid/Cholesterol, follow-up  Last Lipid Panel: Lab Results  Component Value Date   CHOL 202 (H) 09/13/2022   LDLCALC 136 (H) 09/13/2022   HDL 43 09/13/2022   TRIG 126  09/13/2022    He was last seen for this 3 months ago.  Management since that visit includes Start crestor 580mdaily .  He reports excellent compliance with treatment. He is not having side effects.   Symptoms: Yes appetite changes No foot ulcerations  No chest pain No chest pressure/discomfort  No dyspnea No orthopnea  No fatigue No lower extremity edema  No palpitations No paroxysmal nocturnal dyspnea  No nausea No numbness or tingling of extremity  No polydipsia No polyuria  No speech difficulty No syncope    Last metabolic panel Lab Results  Component Value Date   GLUCOSE 141 (H) 09/13/2022   NA 141 09/13/2022   K 3.9 09/13/2022   BUN 10 09/13/2022   CREATININE 1.07 09/13/2022   EGFR 88 09/13/2022   CALCIUM 9.9 09/13/2022   AST 26 09/13/2022   ALT 44 09/13/2022   The 10-year ASCVD risk score (Arnett DK, et al., 2019) is: 17.3%  ---------------------------------------------------------------------------------------------------   Medications: Outpatient Medications Prior to Visit  Medication Sig   clonazePAM (KLONOPIN) 0.5 MG tablet TAKE 1 TABLET BY MOUTH 2 TIMES DAILY AS NEEDED FOR ANXIETY.   Multiple Vitamin (MULTI-VITAMINS) TABS Take by mouth.   [DISCONTINUED] Dulaglutide (TRULICITY) 3 MG0000000OPN Inject 3 mg as directed once a week.   [DISCONTINUED] losartan (COZAAR) 100 MG tablet Take 1 tablet (100 mg total) by mouth daily.   [DISCONTINUED] rosuvastatin (CRESTOR) 5 MG tablet Take 1 tablet (5 mg total) by mouth daily.   [DISCONTINUED] sertraline (ZOLOFT) 100 MG tablet TAKE 2 TABLETS BY MOUTH EVERY DAY   [DISCONTINUED] levothyroxine (SYNTHROID, LEVOTHROID) 112 MCG tablet Take 224  mcg by mouth daily.   No facility-administered medications prior to visit.    Review of Systems per HPI     Objective    BP (!) 143/103 (BP Location: Right Arm, Patient Position: Sitting, Cuff Size: Large)   Pulse 98   Resp 16   Ht 6' (1.829 m)   Wt 245 lb 14.4 oz (111.5 kg)    BMI 33.35 kg/m    Physical Exam Vitals reviewed.  Constitutional:      General: He is not in acute distress.    Appearance: Normal appearance. He is not diaphoretic.  HENT:     Head: Normocephalic and atraumatic.  Eyes:     General: No scleral icterus.    Conjunctiva/sclera: Conjunctivae normal.  Cardiovascular:     Rate and Rhythm: Normal rate and regular rhythm.     Pulses: Normal pulses.     Heart sounds: Normal heart sounds. No murmur heard. Pulmonary:     Effort: Pulmonary effort is normal. No respiratory distress.     Breath sounds: Normal breath sounds. No wheezing or rhonchi.  Musculoskeletal:     Cervical back: Neck supple.     Right lower leg: No edema.     Left lower leg: No edema.  Lymphadenopathy:     Cervical: No cervical adenopathy.  Skin:    General: Skin is warm and dry.     Findings: No rash.  Neurological:     Mental Status: He is alert and oriented to person, place, and time. Mental status is at baseline.  Psychiatric:        Mood and Affect: Mood normal.        Behavior: Behavior normal.       No results found for any visits on 01/07/23.  Assessment & Plan     Problem List Items Addressed This Visit       Cardiovascular and Mediastinum   Hypertension associated with diabetes ([place]) - Primary    Uncontrolled but has missed meds for a few days Continue current medications - will pick up from pharmacy Recheck metabolic panel F/u in 6 months       Relevant Medications   Dulaglutide (TRULICITY) 3 0000000 SOPN   losartan (COZAAR) 100 MG tablet   rosuvastatin (CRESTOR) 5 MG tablet   Other Relevant Orders   Comprehensive metabolic panel     Endocrine   Postablative hypothyroidism    Previously well controlled Continue Synthroid at current dose  Recheck TSH and adjust Synthroid as indicated        Relevant Medications   levothyroxine (SYNTHROID) 112 MCG tablet   Other Relevant Orders   TSH   Type 2 diabetes mellitus ([place])     uncontrolled with hyperglycemia on last A1c Recheck A1c Continue current medications UTD on vaccines, will schedule eye exam On ACEi/ARB On Statin Discussed diet and exercise F/u in 6 months       Relevant Medications   Dulaglutide (TRULICITY) 3 0000000 SOPN   losartan (COZAAR) 100 MG tablet   rosuvastatin (CRESTOR) 5 MG tablet   Other Relevant Orders   Hemoglobin A1c   Hyperlipidemia associated with type 2 diabetes mellitus (HCC)    Previously uncontrolled Continue statin Repeat FLP and CMP Goal LDL < 70       Relevant Medications   Dulaglutide (TRULICITY) 3 0000000 SOPN   losartan (COZAAR) 100 MG tablet   rosuvastatin (CRESTOR) 5 MG tablet   Other Relevant Orders   Lipid panel   Comprehensive metabolic  panel     Other   Obesity (BMI 30.0-34.9)    Discussed importance of healthy weight management Discussed diet and exercise       Other Visit Diagnoses     Colon cancer screening       Relevant Orders   Ambulatory referral to Gastroenterology   Need for COVID-19 vaccine       Relevant Orders   Pfizer Fall 2023 Covid-19 Vaccine 35yr and older (Completed)   Need for immunization against influenza       Relevant Orders   Flu Vaccine QUAD 6+ mos PF IM (Fluarix Quad PF) (Completed)        Return in about 6 months (around 07/08/2023) for CPE.      I, ALavon Paganini MD, have reviewed all documentation for this visit. The documentation on 01/07/23 for the exam, diagnosis, procedures, and orders are all accurate and complete.   Akshith Moncus, ADionne Bucy MD, MPH [facility]

## 2023-01-07 ENCOUNTER — Encounter: Payer: Self-pay | Admitting: Family Medicine

## 2023-01-07 ENCOUNTER — Other Ambulatory Visit: Payer: Self-pay | Admitting: Family Medicine

## 2023-01-07 ENCOUNTER — Ambulatory Visit: Payer: 59 | Admitting: Family Medicine

## 2023-01-07 VITALS — BP 143/103 | HR 98 | Resp 16 | Ht 72.0 in | Wt 245.9 lb

## 2023-01-07 DIAGNOSIS — Z1211 Encounter for screening for malignant neoplasm of colon: Secondary | ICD-10-CM | POA: Diagnosis not present

## 2023-01-07 DIAGNOSIS — E1159 Type 2 diabetes mellitus with other circulatory complications: Secondary | ICD-10-CM | POA: Diagnosis not present

## 2023-01-07 DIAGNOSIS — R809 Proteinuria, unspecified: Secondary | ICD-10-CM

## 2023-01-07 DIAGNOSIS — E1129 Type 2 diabetes mellitus with other diabetic kidney complication: Secondary | ICD-10-CM

## 2023-01-07 DIAGNOSIS — E1169 Type 2 diabetes mellitus with other specified complication: Secondary | ICD-10-CM

## 2023-01-07 DIAGNOSIS — Z23 Encounter for immunization: Secondary | ICD-10-CM | POA: Diagnosis not present

## 2023-01-07 DIAGNOSIS — E890 Postprocedural hypothyroidism: Secondary | ICD-10-CM | POA: Diagnosis not present

## 2023-01-07 DIAGNOSIS — E785 Hyperlipidemia, unspecified: Secondary | ICD-10-CM | POA: Diagnosis not present

## 2023-01-07 DIAGNOSIS — E669 Obesity, unspecified: Secondary | ICD-10-CM | POA: Diagnosis not present

## 2023-01-07 DIAGNOSIS — I152 Hypertension secondary to endocrine disorders: Secondary | ICD-10-CM | POA: Diagnosis not present

## 2023-01-07 MED ORDER — ROSUVASTATIN CALCIUM 5 MG PO TABS: 5.0000 mg | ORAL_TABLET | Freq: Every day | ORAL | 3 refills | Status: DC

## 2023-01-07 MED ORDER — LEVOTHYROXINE SODIUM 112 MCG PO TABS: 224.0000 ug | ORAL_TABLET | Freq: Every day | ORAL | 1 refills | Status: DC

## 2023-01-07 MED ORDER — LOSARTAN POTASSIUM 100 MG PO TABS: 100.0000 mg | ORAL_TABLET | Freq: Every day | ORAL | 3 refills | Status: DC

## 2023-01-07 MED ORDER — SERTRALINE HCL 100 MG PO TABS: 200.0000 mg | ORAL_TABLET | Freq: Every day | ORAL | 3 refills | Status: DC

## 2023-01-07 MED ORDER — TRULICITY 3 MG/0.5ML ~~LOC~~ SOAJ: 3.0000 mg | SUBCUTANEOUS | 5 refills | Status: DC

## 2023-01-07 NOTE — Assessment & Plan Note (Signed)
Previously well controlled Continue Synthroid at current dose  Recheck TSH and adjust Synthroid as indicated   

## 2023-01-07 NOTE — Assessment & Plan Note (Signed)
uncontrolled with hyperglycemia on last A1c Recheck A1c Continue current medications UTD on vaccines, will schedule eye exam On ACEi/ARB On Statin Discussed diet and exercise F/u in 6 months

## 2023-01-07 NOTE — Assessment & Plan Note (Signed)
Discussed importance of healthy weight management Discussed diet and exercise  

## 2023-01-07 NOTE — Assessment & Plan Note (Signed)
Uncontrolled but has missed meds for a few days Continue current medications - will pick up from pharmacy Recheck metabolic panel F/u in 6 months

## 2023-01-07 NOTE — Assessment & Plan Note (Signed)
Previously uncontrolled Continue statin Repeat FLP and CMP Goal LDL < 70

## 2023-01-08 LAB — LIPID PANEL
Chol/HDL Ratio: 3.9 ratio (ref 0.0–5.0)
Cholesterol, Total: 131 mg/dL (ref 100–199)
HDL: 34 mg/dL — ABNORMAL LOW (ref >39)
LDL Chol Calc (NIH): 81 mg/dL (ref 0–99)
Triglycerides: 81 mg/dL (ref 0–149)
VLDL Cholesterol Cal: 16 mg/dL (ref 5–40)

## 2023-01-08 LAB — HEMOGLOBIN A1C
Est. average glucose Bld gHb Est-mCnc: 134 mg/dL
Hgb A1c MFr Bld: 6.3 % — ABNORMAL HIGH (ref 4.8–5.6)

## 2023-01-08 LAB — COMPREHENSIVE METABOLIC PANEL
ALT: 36 IU/L (ref 0–44)
AST: 23 IU/L (ref 0–40)
Albumin/Globulin Ratio: 2.2 (ref 1.2–2.2)
Albumin: 4.6 g/dL (ref 4.1–5.1)
Alkaline Phosphatase: 69 IU/L (ref 44–121)
BUN/Creatinine Ratio: 12 (ref 9–20)
BUN: 13 mg/dL (ref 6–24)
Bilirubin Total: 0.4 mg/dL (ref 0.0–1.2)
CO2: 21 mmol/L (ref 20–29)
Calcium: 9.4 mg/dL (ref 8.7–10.2)
Chloride: 106 mmol/L (ref 96–106)
Creatinine, Ser: 1.13 mg/dL (ref 0.76–1.27)
Globulin, Total: 2.1 g/dL (ref 1.5–4.5)
Glucose: 104 mg/dL — ABNORMAL HIGH (ref 70–99)
Potassium: 4.2 mmol/L (ref 3.5–5.2)
Sodium: 143 mmol/L (ref 134–144)
Total Protein: 6.7 g/dL (ref 6.0–8.5)
eGFR: 82 mL/min/{1.73_m2} (ref >59)

## 2023-01-08 LAB — TSH: TSH: 0.194 u[IU]/mL — ABNORMAL LOW (ref 0.450–4.500)

## 2023-01-11 ENCOUNTER — Other Ambulatory Visit: Payer: Self-pay

## 2023-01-11 DIAGNOSIS — E890 Postprocedural hypothyroidism: Secondary | ICD-10-CM

## 2023-01-11 NOTE — Progress Notes (Signed)
Please put in TSH order to repeat in 2 months

## 2023-01-11 NOTE — Progress Notes (Signed)
01/10/2023  9:13 AM EST     Normal/stable labs. A1c is well controlled. TSH is slightly low, suggesting that synthroid dose is slightly too high. Just do 2 pills daily and not 3 on Mondays.  Recheck TSH in 2 months.    Romana Juniper, RN 01/11/2023  9:44 AM EST     Patient informed of lab result and provider recommendations- will get lab repeat for TSH in 2 months- please put future order in

## 2023-01-17 ENCOUNTER — Encounter: Payer: Self-pay | Admitting: *Deleted

## 2023-01-18 ENCOUNTER — Other Ambulatory Visit: Payer: Self-pay | Admitting: Family Medicine

## 2023-01-19 NOTE — Telephone Encounter (Signed)
Requested medication (s) are due for refill today: yes  Requested medication (s) are on the active medication list: yes  Last refill:  01/07/23  Future visit scheduled: yes  Notes to clinic:    Pharmacy comment: Alternative Requested:NEEDS PA.       Requested Prescriptions  Pending Prescriptions Disp Refills   TRULICITY 3 0000000 SOPN [Pharmacy Med Name: TRULICITY 3 [id number] ML PEN]  5    Sig: Inject 3 mg as directed once a week.     Endocrinology:  Diabetes - GLP-1 Receptor Agonists Passed - 01/18/2023  2:32 PM      Passed - HBA1C is between 0 and 7.9 and within 180 days    Hgb A1c MFr Bld  Date Value Ref Range Status  01/07/2023 6.3 (H) 4.8 - 5.6 % Final    Comment:             Prediabetes: 5.7 - 6.4          Diabetes: >6.4          Glycemic control for adults with diabetes: <7.0          Passed - Valid encounter within last 6 months    Recent Outpatient Visits           1 week ago Hypertension associated with diabetes [facility])   [place], Dionne Bucy, MD   3 months ago Type 2 diabetes mellitus without complication, without long-term current use of insulin [facility])   [place], Dionne Bucy, MD   4 months ago Type 2 diabetes mellitus without complication, without long-term current use of insulin [facility])   [place], Dionne Bucy, MD   5 months ago GAD (generalized anxiety disorder)   [place] Simmons-Robinson, [place], MD   8 months ago Type 2 diabetes mellitus without complication, without long-term current use of insulin [facility])   [place], Dionne Bucy, MD       Future Appointments             In 5 months Bacigalupo, Dionne Bucy, MD [facility], [name]

## 2023-01-22 ENCOUNTER — Other Ambulatory Visit: Payer: Self-pay | Admitting: Family Medicine

## 2023-01-27 DIAGNOSIS — G4733 Obstructive sleep apnea (adult) (pediatric): Secondary | ICD-10-CM | POA: Diagnosis not present

## 2023-02-27 DIAGNOSIS — G4733 Obstructive sleep apnea (adult) (pediatric): Secondary | ICD-10-CM | POA: Diagnosis not present

## 2023-02-28 ENCOUNTER — Other Ambulatory Visit: Payer: Self-pay | Admitting: Family Medicine

## 2023-03-01 NOTE — Telephone Encounter (Signed)
Requested medication (s) are due for refill today - yes  Requested medication (s) are on the active medication list -yes  Future visit scheduled -yes  Last refill: 01/07/23 315 1RF  Notes to clinic: non delegated Rx  Requested Prescriptions  Pending Prescriptions Disp Refills   clonazePAM (KLONOPIN) 0.5 MG tablet [Pharmacy Med Name: CLONAZEPAM 0.5 MG TABLET] 15 tablet 1    Sig: TAKE 1 TABLET BY MOUTH TWICE A DAY AS NEEDED FOR ANXIETY     Not Delegated - Psychiatry: Anxiolytics/Hypnotics 2 Failed - 02/28/2023 10:41 AM      Failed - This refill cannot be delegated      Failed - Urine Drug Screen completed in last 360 days      Passed - Patient is not pregnant      Passed - Valid encounter within last 6 months    Recent Outpatient Visits           1 month ago Hypertension associated with diabetes [facility])   [place] [facility] [place], [name], MD   5 months ago Type 2 diabetes mellitus without complication, without long-term current use of insulin [facility])   [facility] [place], [name], MD   5 months ago Type 2 diabetes mellitus without complication, without long-term current use of insulin (HCC)   [place] [facility] [place], [name], MD   6 months ago GAD (generalized anxiety disorder)   [place] [facility] Simmons-Robinson, [place], MD   9 months ago Type 2 diabetes mellitus without complication, without long-term current use of insulin (HCC)   [place] [facility] [place], [name], MD       Future Appointments             In 4 months Bacigalupo, [name], MD [facility], [name]               Requested Prescriptions  Pending Prescriptions Disp Refills   clonazePAM (KLONOPIN) 0.5 MG tablet [Pharmacy Med Name: CLONAZEPAM 0.5 MG TABLET] 15 tablet 1    Sig: TAKE 1 TABLET BY MOUTH TWICE A DAY AS NEEDED FOR ANXIETY     Not Delegated  - Psychiatry: Anxiolytics/Hypnotics 2 Failed - 02/28/2023 10:41 AM      Failed - This refill cannot be delegated      Failed - Urine Drug Screen completed in last 360 days      Passed - Patient is not pregnant      Passed - Valid encounter within last 6 months    Recent Outpatient Visits           1 month ago Hypertension associated with diabetes [facility])   [place] [facility] [place], [name], MD   5 months ago Type 2 diabetes mellitus without complication, without long-term current use of insulin [facility])   [place] [facility] [place], [name], MD   5 months ago Type 2 diabetes mellitus without complication, without long-term current use of insulin [facility])   [place] [facility] [place], [name], MD   6 months ago GAD (generalized anxiety disorder)   [place] [facility] Simmons-Robinson, [place], MD   9 months ago Type 2 diabetes mellitus without complication, without long-term current use of insulin [facility])   [place] [facility] [place], [name], MD       Future Appointments  In 4 months Bacigalupo, [name], MD [facility]

## 2023-03-17 ENCOUNTER — Ambulatory Visit: Payer: 59 | Admitting: Family Medicine

## 2023-03-29 DIAGNOSIS — G4733 Obstructive sleep apnea (adult) (pediatric): Secondary | ICD-10-CM | POA: Diagnosis not present

## 2023-04-12 ENCOUNTER — Encounter: Payer: Self-pay | Admitting: Family Medicine

## 2023-04-12 NOTE — Telephone Encounter (Signed)
Will need to switch from trulicity to 1mg  Ozempic weekly (equivalent to current dose of trulicity)

## 2023-04-13 ENCOUNTER — Other Ambulatory Visit: Payer: Self-pay

## 2023-04-13 MED ORDER — SEMAGLUTIDE (1 MG/DOSE) 4 MG/3ML ~~LOC~~ SOPN: 1.0000 mg | PEN_INJECTOR | SUBCUTANEOUS | 0 refills | Status: DC

## 2023-04-21 NOTE — Telephone Encounter (Signed)
PA started today.  

## 2023-04-26 ENCOUNTER — Other Ambulatory Visit: Payer: Self-pay

## 2023-04-26 MED ORDER — TRULICITY 3 MG/0.5ML ~~LOC~~ SOAJ: 3.0000 mg | SUBCUTANEOUS | 5 refills | Status: DC

## 2023-04-28 ENCOUNTER — Other Ambulatory Visit: Payer: Self-pay | Admitting: Family Medicine

## 2023-04-28 MED ORDER — TIRZEPATIDE 2.5 MG/0.5ML ~~LOC~~ SOAJ: 2.5000 mg | SUBCUTANEOUS | 1 refills | Status: DC

## 2023-04-28 NOTE — Telephone Encounter (Signed)
Requested medication (s) are due for refill today: alternative medication request  Requested medication (s) are on the active medication list: yes  Last refill:  04/28/23  Future visit scheduled: yes  Notes to clinic:  Pharmacy comment: Alternative Requested:THE PRESCRIBED MEDICATION IS NOT COVERED BY INSURANCE. PLEASE CONSIDER CHANGING TO ONE OF THE SUGGESTED COVERED ALTERNATIVES.      Requested Prescriptions  Pending Prescriptions Disp Refills   TRULICITY 0.75 MG/0.5ML SOPN [Pharmacy Med Name: TRULICITY 0.75 MG/0.5 ML PEN]  0     Endocrinology:  Diabetes - GLP-1 Receptor Agonists Passed - 04/28/2023 10:58 AM      Passed - HBA1C is between 0 and 7.9 and within 180 days    Hgb A1c MFr Bld  Date Value Ref Range Status  01/07/2023 6.3 (H) 4.8 - 5.6 % Final    Comment:             Prediabetes: 5.7 - 6.4          Diabetes: >6.4          Glycemic control for adults with diabetes: <7.0          Passed - Valid encounter within last 6 months    Recent Outpatient Visits           3 months ago Hypertension associated with diabetes [name]   [name], [name], MD   7 months ago Type 2 diabetes mellitus without complication, without long-term current use of insulin [name]   [name], [name], MD   7 months ago Type 2 diabetes mellitus without complication, without long-term current use of insulin [name]   [name], [name], MD   8 months ago GAD (generalized anxiety disorder)   [name], [name], MD   11 months ago Type 2 diabetes mellitus without complication, without long-term current use of insulin [name]    [facility] [place], [name], MD       Future Appointments             In 2 months Bacigalupo, [name], MD [name], [name]

## 2023-04-28 NOTE — Telephone Encounter (Signed)
Ok to give Ozempic sample x1 month to patient (I think we only have the 0.5 mg dose). Seems like we do not need to call, as he'd like to try mounjaro.

## 2023-04-29 ENCOUNTER — Other Ambulatory Visit: Payer: Self-pay | Admitting: Family Medicine

## 2023-04-29 NOTE — Telephone Encounter (Signed)
Requested medications are due for refill today.  See pharmacy note  Requested medications are on the active medications list.  no  Last refill. 04/28/2023  Future visit scheduled.   yes  Notes to clinic.  Pharmacy comment: Alternative Requested:PRIOR AUTH NOT COVERED,     Requested Prescriptions  Pending Prescriptions Disp Refills   TRULICITY 0.75 MG/0.5ML SOPN [Pharmacy Med Name: TRULICITY 0.75 MG/0.5 ML PEN]  0     Endocrinology:  Diabetes - GLP-1 Receptor Agonists Passed - 04/29/2023  9:48 AM      Passed - HBA1C is between 0 and 7.9 and within 180 days    Hgb A1c MFr Bld  Date Value Ref Range Status  01/07/2023 6.3 (H) 4.8 - 5.6 % Final    Comment:             Prediabetes: 5.7 - 6.4          Diabetes: >6.4          Glycemic control for adults with diabetes: <7.0          Passed - Valid encounter within last 6 months    Recent Outpatient Visits           3 months ago Hypertension associated with diabetes [facility])   [place] [facility] [place], [name], MD   7 months ago Type 2 diabetes mellitus without complication, without long-term current use of insulin [facility])   [place] [facility] [place], [name], MD   7 months ago Type 2 diabetes mellitus without complication, without long-term current use of insulin [facility])   [place] [facility] [place], [name], MD   8 months ago GAD (generalized anxiety disorder)   [place] [facility] Simmons-Robinson, [place], MD   11 months ago Type 2 diabetes mellitus without complication, without long-term current use of insulin [facility])   [place] [facility] [place], [name], MD       Future Appointments             In 2 months Bacigalupo, [name], MD [facility], [name]

## 2023-04-29 NOTE — Telephone Encounter (Signed)
Requested medications are due for refill today.  See pharmacy note  Requested medications are on the active medications list.  See notes  Last refill. See note  Future visit scheduled.   yes  Notes to clinic.  Pharmacy comment: Alternative Requested:PRIOR AUTH.     Requested Prescriptions  Pending Prescriptions Disp Refills   TRULICITY 0.75 MG/0.5ML SOPN [Pharmacy Med Name: TRULICITY 0.75 MG/0.5 ML PEN]  0     Endocrinology:  Diabetes - GLP-1 Receptor Agonists Passed - 04/29/2023  2:44 PM      Passed - HBA1C is between 0 and 7.9 and within 180 days    Hgb A1c MFr Bld  Date Value Ref Range Status  01/07/2023 6.3 (H) 4.8 - 5.6 % Final    Comment:             Prediabetes: 5.7 - 6.4          Diabetes: >6.4          Glycemic control for adults with diabetes: <7.0          Passed - Valid encounter within last 6 months    Recent Outpatient Visits           3 months ago Hypertension associated with diabetes [facility])   [place] [facility] [place], [name], MD   7 months ago Type 2 diabetes mellitus without complication, without long-term current use of insulin [facility])   [place] [facility] [place], [name], MD   7 months ago Type 2 diabetes mellitus without complication, without long-term current use of insulin [facility])   [place] [facility] [place], [name], MD   8 months ago GAD (generalized anxiety disorder)   [place] [facility] Simmons-Robinson, [place], MD   11 months ago Type 2 diabetes mellitus without complication, without long-term current use of insulin [facility])   [place] [facility] [place], [name], MD       Future Appointments             In 2 months Bacigalupo, [name], MD [facility], [name]

## 2023-05-04 ENCOUNTER — Telehealth: Payer: Self-pay

## 2023-05-04 DIAGNOSIS — I152 Hypertension secondary to endocrine disorders: Secondary | ICD-10-CM

## 2023-05-04 DIAGNOSIS — R809 Proteinuria, unspecified: Secondary | ICD-10-CM

## 2023-05-04 NOTE — Telephone Encounter (Signed)
Copied from CRM [id number]. Topic: General - Other >> May 03, 2023  2:46 PM Carrielelia G wrote: Reason for CRM: Pt. Edward Hurley would like a call back regarding the status on his tirzepatide [facility]) 2.5 MG/0.5ML Pen [[id number]]  Please advise

## 2023-05-04 NOTE — Telephone Encounter (Signed)
This pt's wife came by and states that the pharmacy is needing a PA started on Mounjaro.  I advised her to get the pharmacy to send [place] something to start it as I do not see one in his chart for this.

## 2023-05-04 NOTE — Telephone Encounter (Signed)
Pt's wife picked up sample of Ozempic.  She was questioning if he should take two doses since it is a lower dose? Please advise and let pt know

## 2023-05-06 NOTE — Telephone Encounter (Signed)
Reviewed. Please notify patient. Recommend discussion with PCP upon return for plan moving forward with available agent.

## 2023-05-06 NOTE — Telephone Encounter (Signed)
Patient advised and would like a call back once further information is received  [facility]

## 2023-05-06 NOTE — Telephone Encounter (Signed)
Denied on June 13 Your PA request has been denied. Additional information will be provided in the denial communication.

## 2023-05-10 ENCOUNTER — Other Ambulatory Visit: Payer: Self-pay | Admitting: Family Medicine

## 2023-05-18 NOTE — Telephone Encounter (Signed)
Pt's wife came by and states that the pharmacy sent them notification that you did not respond to additional information that was requested for the PA.  She states that the pt has been using the sample of Ozempic and it is working well and would like that if possible.  There are multiple messages about this issue.  I am not sure what needs to be done or what to advise the pt.  I gave the pt another sample that was left and she says that will last him two weeks.  She is asking if he can have another sample if this is not going to be resolved.  Please advise

## 2023-05-19 NOTE — Telephone Encounter (Signed)
We can give another sample of ozempic.  Let's refer to pharmacy [phone]) to see if the pharmacist can help [place]. His insurance has denied mounjaro and ozempic and Trulicity is backordered.

## 2023-05-19 NOTE — Addendum Note (Signed)
Addended by: Hyacinth Meeker on: 05/19/2023 11:29 AM   Modules accepted: Orders

## 2023-05-23 ENCOUNTER — Other Ambulatory Visit: Payer: Self-pay | Admitting: Pharmacist

## 2023-05-23 NOTE — Progress Notes (Unsigned)
05/23/2023 Name: Edward Hurley MRN: [id number] DOB: June 05, 1976  Chief Complaint  Patient presents with   Medication Access    Edward Hurley is a 47 y.o. year old male who presented for a telephone visit.   They were referred to the pharmacist by their PCP for assistance in managing medication access.    Subjective:  Care Team: Primary Care Provider: Erasmo Downer, MD ; Next Scheduled Visit: 07/08/2023  Medication Access/Adherence  [facility] Pharmacy[facility]  [facility] [address] [place] - [address] [place] [address] Phone: [phone] Fax: [phone]  [id number] [place] [facility] [place] - [address] [place] [address] Phone: [phone] Fax: [phone]  [id number] [place] - [address] AT CORNER OF [facility] [address] [place] [address] Phone: [phone] Fax: [phone]  [id number] [place] - [address] [place] [address] Phone: [phone] Fax: [phone]   Patient reports affordability concerns with their medications: Yes  Patient reports access/transportation concerns to their pharmacy: Yes  Patient reports adherence concerns with their medications:  Yes    Per referral, patient has been unable to obtain Trulicity 3 mg  [facility] advises pharmacy has been unable to stock Trulicity 3 mg strength in a long time due to the national back order - Note Trulicity 1.5 mg strength, while also in limited supply, is coming in occasionally  From review of chart, note PCP did try to prescribe both Mounjaro and Ozempic for patient and completed prior authorizations for both, but both PAs were denied  - Per PA denial letters from [place] plan in chart, plan requires patient to have tried and failed/unable to take/etc both of the formulary  alternatives (Trulicity and Victoza) - Note patient currently receiving Ozempic via samples from office   Diabetes:  Current medications: Ozempic 0.5 mg - Taking 2 injections (1 mg total) weekly from samples from office Medications tried in the past: Trulicity 3 mg  Denies monitoring home blood sugar  Today patient reports that he has noticed significant benefit of taking the Ozempic 1 mg weekly compared to when he was on the Trulicity 3 mg weekly in terms of appetite control/weight loss benefit. Reports Ozempic has significantly curbed his appetite and would like to remain on this medication.  - States prefers to try appealing decision from his insurance regarding Ozempic before considering restarting Trulicity  - Reports has lost ~5 lbs since started on Ozempic ~3 weeks ago  Current Medication Access Support: Reports previously using manufacturer savings card to lower cost of Trulicity. Per review of patient's insurance card in chart, note patient's plan has an individual annual deductible of $7,500  Objective:  Lab Results  Component Value Date   HGBA1C 6.3 (H) 01/07/2023    Lab Results  Component Value Date   CREATININE 1.13 01/07/2023   BUN 13 01/07/2023   NA 143 01/07/2023   K 4.2 01/07/2023   CL 106 01/07/2023   CO2 21 01/07/2023    Lab Results  Component Value Date   CHOL 131 01/07/2023   HDL 34 (L) 01/07/2023   LDLCALC 81 01/07/2023   TRIG 81 01/07/2023   CHOLHDL 3.9 01/07/2023    Medications Reviewed Today     Reviewed by Erasmo Downer, MD (Physician) on 01/07/23 at 1049  Med List Status: <None>   Medication Order Taking? Sig Documenting Provider Last  Dose Status Informant  clonazePAM (KLONOPIN) 0.5 MG tablet [id number] Yes TAKE 1 TABLET BY MOUTH 2 TIMES DAILY AS NEEDED FOR ANXIETY. Jacky Kindle, FNP Taking Active   Dulaglutide (TRULICITY) 3 MG/0.5ML Namon Cirri [id number]  Inject 3 mg as directed once a week. Erasmo Downer, MD  Active    levothyroxine (SYNTHROID) 112 MCG tablet [id number]  Take 2 tablets (224 mcg total) by mouth daily. Except on Mondays, take 3 pills PO. Erasmo Downer, MD  Active   losartan (COZAAR) 100 MG tablet [id number]  Take 1 tablet (100 mg total) by mouth daily. Erasmo Downer, MD  Active   Multiple Vitamin (MULTI-VITAMINS) TABS [id number] Yes Take by mouth. [provider] Taking Active   rosuvastatin (CRESTOR) 5 MG tablet [id number]  Take 1 tablet (5 mg total) by mouth daily. Erasmo Downer, MD  Active   sertraline (ZOLOFT) 100 MG tablet [id number]  Take 2 tablets (200 mg total) by mouth daily. Erasmo Downer, MD  Active               Assessment/Plan:   Diabetes/Medication Access: - Patient plans to contact his [employer] insurance today to appeal PA denial for Ozempic as patient preference to continue on Ozempic, rather than restart Trulicity. - Discuss with patient that if appeal is unsuccessful, options to either try to locate Trulicity 3 mg weekly strength or Trulicity 1.5 mg strength currently available at some local pharmacies. Offer to try to aid patient with locating Trulicity 3 mg weekly strength with help from [employer] rep, but patient declines today (prefers to focus on trying to continue Ozempic) - Note Victoza also a preferred option per patient's health plan, but not currently affordable to patient as no copayment savings card available from manufacturer for this product   Follow Up Plan: Clinical Pharmacist will outreach to patient by telephone again on 06/22/2023 at 3 pm  Estelle Grumbles, PharmD, [facility] [phone]

## 2023-05-25 NOTE — Patient Instructions (Signed)
Goals Addressed             This Visit's Progress    Pharmacy Goals       Our goal A1c is less than 7%. This corresponds with fasting sugars less than 130 and 2 hour after meal sugars less than 180. Please keep a log of your results when checking your blood sugar   Our goal bad cholesterol, or LDL, is less than 70. This is why it is important to continue taking your rosuvastatin.  Our next telephone call is scheduled for 06/22/2023 at 3:00 PM   Estelle Grumbles, PharmD, [facility] [phone]

## 2023-05-30 DIAGNOSIS — G4733 Obstructive sleep apnea (adult) (pediatric): Secondary | ICD-10-CM | POA: Diagnosis not present

## 2023-06-01 ENCOUNTER — Telehealth: Payer: Self-pay | Admitting: Family Medicine

## 2023-06-01 NOTE — Telephone Encounter (Signed)
Pt took the medication last Wednesday.  Pt leaving out of town until Friday.

## 2023-06-02 NOTE — Telephone Encounter (Signed)
Left detailed message advising of no samples of Ozempic.

## 2023-06-02 NOTE — Telephone Encounter (Signed)
Ok to give sample if we have one.

## 2023-06-02 NOTE — Telephone Encounter (Signed)
We'll just have to let him know that we don't have any. He spoke to pharmacist about options

## 2023-06-06 ENCOUNTER — Encounter: Payer: Self-pay | Admitting: Family Medicine

## 2023-06-07 NOTE — Telephone Encounter (Signed)
 Faxed

## 2023-06-09 ENCOUNTER — Telehealth: Payer: Self-pay

## 2023-06-09 NOTE — Telephone Encounter (Signed)
Copied from CRM [id number]. Topic: General - Other >> Jun 09, 2023  8:24 AM Lennox Pippins wrote: Patient's wife called to see if PCP could provide patient with samples of Ozempic. Per patient's wife, he has received samples about a month ago from PCP before and he is completely out and has not been able to get Ozempic anywhere. Patient's wife, Morrie Sheldon, has an appt next door in about 30 minutes, today, 7.18.24, and stated she could stop by and pick up the samples for patient. Please Advise.  Patients wife's callback # [phone]

## 2023-06-16 NOTE — Telephone Encounter (Signed)
No samples available. We have contested his insurance's denial of ozempic and awaiting decision.

## 2023-06-17 ENCOUNTER — Telehealth: Payer: Self-pay

## 2023-06-17 NOTE — Telephone Encounter (Signed)
Copied from CRM [id number]. Topic: General - Inquiry >> Jun 17, 2023 11:28 AM Haroldine Laws wrote: Reason for CRM: pt is wanting to know if the office has any samples of ozempic.  CB@  [phone]

## 2023-06-17 NOTE — Telephone Encounter (Signed)
Mychart message sent.

## 2023-06-20 NOTE — Telephone Encounter (Signed)
Patient advised through mychart of no samples available.

## 2023-06-22 ENCOUNTER — Other Ambulatory Visit: Payer: Self-pay | Admitting: Pharmacist

## 2023-06-22 ENCOUNTER — Telehealth: Payer: Self-pay | Admitting: Pharmacist

## 2023-06-22 NOTE — Progress Notes (Signed)
   Outreach Note  06/22/2023 Name: Edward Hurley MRN: [id number] DOB: 26-Dec-1975  Referred by: Erasmo Downer, MD   Was unable to reach patient via telephone today and have left HIPAA compliant voicemail asking patient to return my call.    Follow Up Plan: Will collaborate with Care Guide to outreach to schedule follow up with me  Estelle Grumbles, PharmD, [facility] Clinical Pharmacist [facility] [phone]

## 2023-06-29 ENCOUNTER — Other Ambulatory Visit: Payer: Self-pay | Admitting: Family Medicine

## 2023-06-30 DIAGNOSIS — G4733 Obstructive sleep apnea (adult) (pediatric): Secondary | ICD-10-CM | POA: Diagnosis not present

## 2023-07-08 ENCOUNTER — Encounter: Payer: 59 | Admitting: Family Medicine

## 2023-07-08 NOTE — Progress Notes (Deleted)
Complete physical exam  Patient: Edward Hurley   DOB: 02/23/1976   46 y.o. Male  MRN: [id number]  Subjective:    No chief complaint on file.   Edward Hurley is a 47 y.o. male who presents today for a complete physical exam. He reports consuming a {diet types:17450} diet. {types:19826} He generally feels {DESC; WELL/FAIRLY WELL/POORLY:18703}. He reports sleeping {DESC; WELL/FAIRLY WELL/POORLY:18703}. He {does/does not:200015} have additional problems to discuss today.    Discussed the use of AI scribe software for clinical note transcription with the patient, who gave verbal consent to proceed.  History of Present Illness           Most recent fall risk assessment:    01/07/2023   10:24 AM  Fall Risk   Falls in the past year? 0  Number falls in past yr: 0  Injury with Fall? 0  Risk for fall due to : No Fall Risks     Most recent depression screenings:    01/07/2023   10:24 AM 09/30/2022    1:15 PM  PHQ 2/9 Scores  PHQ - 2 Score 2 2  PHQ- 9 Score 8 13    {VISON DENTAL STD PSA (Optional):27386}  {History (Optional):23778}  Patient Care Team: Erasmo Downer, MD as PCP - General (Family Medicine) Ronney Asters, Jackelyn Poling, RPH-CPP as Pharmacist   Outpatient Medications Prior to Visit  Medication Sig   clonazePAM (KLONOPIN) 0.5 MG tablet TAKE 1 TABLET BY MOUTH TWICE A DAY AS NEEDED FOR ANXIETY   Dulaglutide (TRULICITY) 0.75 MG/0.5ML SOPN Prescription for mounjaro was sent in.   levothyroxine (SYNTHROID) 112 MCG tablet Take 2 tablets (224 mcg total) by mouth daily. Except on Mondays, take 3 pills PO.   losartan (COZAAR) 100 MG tablet Take 1 tablet (100 mg total) by mouth daily.   Multiple Vitamin (MULTI-VITAMINS) TABS Take by mouth.   rosuvastatin (CRESTOR) 5 MG tablet Take 1 tablet (5 mg total) by mouth daily.   sertraline (ZOLOFT) 100 MG tablet Take 2 tablets (200 mg total) by mouth daily.   No facility-administered medications prior to visit.     ROS        Objective:     There were no vitals taken for this visit. {Vitals History (Optional):23777}  Physical Exam   No results found for any visits on 07/08/23. {Show previous labs (optional):23779}    Assessment & Plan:    Routine Health Maintenance and Physical Exam  Immunization History  Administered Date(s) Administered   COVID-19, mRNA, vaccine(Comirnaty)12 years and older 01/07/2023   Influenza Inj Mdck Quad Pf 09/01/2019   Influenza,inj,Quad PF,6+ Mos 09/09/2018, 09/01/2019, 01/07/2023   Influenza-Unspecified 08/06/2014, 08/22/2014, 09/09/2018, 09/01/2019   Moderna Covid-19 Vaccine Bivalent Booster 75yrs & up 09/30/2020   Moderna Sars-Covid-2 Vaccination 01/24/2020, 02/21/2020   Td 01/15/2009   Tdap 01/15/2009, 05/13/2022    Health Maintenance  Topic Date Due   OPHTHALMOLOGY EXAM  Never done   Colonoscopy  Never done   INFLUENZA VACCINE  06/23/2023   HEMOGLOBIN A1C  07/08/2023   Diabetic kidney evaluation - Urine ACR  09/14/2023   FOOT EXAM  09/14/2023   Diabetic kidney evaluation - eGFR measurement  01/08/2024   DTaP/Tdap/Td (4 - Td or Tdap) 05/13/2032   COVID-19 Vaccine  Completed   Hepatitis C Screening  Completed   HIV Screening  Completed   HPV VACCINES  Aged Out    Discussed health benefits of physical activity, and encouraged him to engage in regular exercise  appropriate for his age and condition.  Problem List Items Addressed This Visit   None                No follow-ups on file.     Shirlee Latch, MD

## 2023-07-27 ENCOUNTER — Other Ambulatory Visit: Payer: Self-pay | Admitting: Pharmacist

## 2023-07-27 DIAGNOSIS — R809 Proteinuria, unspecified: Secondary | ICD-10-CM

## 2023-07-27 NOTE — Progress Notes (Addendum)
   07/27/2023 Name: Edward Hurley MRN: [id number] DOB: 09-22-1976  Chief Complaint  Patient presents with   Medication Access    Edward Hurley is a 47 y.o. year old male who presented for a telephone visit.   They were referred to the pharmacist by their PCP for assistance in managing medication access.    Subjective:  Care Team: Primary Care Provider: Erasmo Downer, MD  Medication Access/Adherence  [facility]  [phone] [address] [place] - [address] [place] [address] Phone: [phone] Fax: [phone]  [id number] [place], [place] - [address] [address] Phone: [phone] Fax: [phone]  [id number] [place], [place] - [address] AT CORNER OF [facility] [address] [place] [address] Phone: [phone] Fax: [phone]  [facility] [address], [place] - [address] [place] [address] Phone: [phone] Fax: [phone]   Patient reports affordability concerns with their medications: Yes  Patient reports access/transportation concerns to their pharmacy: No  Patient reports adherence concerns with their medications:  No     Diabetes:   Current medications: None  Note patient previously taking Ozempic 0.5 mg - Taking 2 injections (1 mg total) weekly from samples from office  Medications tried in the past: Trulicity 3 mg, Ozempic   Denies monitoring home blood sugar   Today patient reports that he has been out of Ozempic for the past month. Patient preferred to wait on appealing decision from his insurance regarding Ozempic before considering restarting Trulicity or changing to an alternative medication as has noticed significant benefit of taking the Ozempic in terms of appetite control/weight loss benefit.    Current Medication Access Support: Reports previously using  manufacturer savings card to lower cost of Trulicity. Per review of patient's insurance card in chart, note patient's plan has an individual annual deductible of $7,500   Objective:  Lab Results  Component Value Date   HGBA1C 6.3 (H) 01/07/2023    Lab Results  Component Value Date   CREATININE 1.13 01/07/2023   BUN 13 01/07/2023   NA 143 01/07/2023   K 4.2 01/07/2023   CL 106 01/07/2023   CO2 21 01/07/2023    Lab Results  Component Value Date   CHOL 131 01/07/2023   HDL 34 (L) 01/07/2023   LDLCALC 81 01/07/2023   TRIG 81 01/07/2023   CHOLHDL 3.9 01/07/2023    Medications Reviewed Today   Medications were not reviewed in this encounter       Assessment/Plan:   Diabetes/Medication Access: - Patient denies interest in restarting Trulicity at this time as compared to Ozempic, did not find Trulicity to be as effective for appetite control/weight loss benefit - Per patient's health plan, Victoza is also a preferred GLP-1 receptor agonist option on patient's plan Advise patient that this option might not be affordable as no copayment savings card available from manufacturer for this product. Patient requests to find out cost of this option at his pharmacy Will collaborate with his PCP regarding this prescription - Patient request prescriptions for blood sugar testing supplies Will collaborate with PCP   Follow Up Plan: Clinical Pharmacist will follow up with pharmacy/patient by telephone again within the next 7 days.   Estelle Grumbles, PharmD, [facility] [phone]

## 2023-07-29 MED ORDER — LIRAGLUTIDE 18 MG/3ML ~~LOC~~ SOPN: PEN_INJECTOR | SUBCUTANEOUS | 3 refills | Status: DC

## 2023-07-30 ENCOUNTER — Other Ambulatory Visit: Payer: Self-pay | Admitting: Family Medicine

## 2023-07-31 DIAGNOSIS — G4733 Obstructive sleep apnea (adult) (pediatric): Secondary | ICD-10-CM | POA: Diagnosis not present

## 2023-08-01 ENCOUNTER — Other Ambulatory Visit: Payer: Self-pay | Admitting: Family Medicine

## 2023-08-01 ENCOUNTER — Other Ambulatory Visit: Payer: Self-pay | Admitting: Pharmacist

## 2023-08-01 DIAGNOSIS — R809 Proteinuria, unspecified: Secondary | ICD-10-CM

## 2023-08-01 MED ORDER — ONETOUCH DELICA LANCETS 33G MISC: 3 refills | Status: AC

## 2023-08-01 MED ORDER — ONETOUCH ULTRA 2 W/DEVICE KIT: PACK | 0 refills | Status: DC

## 2023-08-01 MED ORDER — ONETOUCH ULTRA TEST VI STRP: ORAL_STRIP | 12 refills | Status: DC

## 2023-08-01 NOTE — Patient Instructions (Signed)
Goals Addressed             This Visit's Progress    Pharmacy Goals       Our goal A1c is less than 7%. This corresponds with fasting sugars less than 130 and 2 hour after meal sugars less than 180. Please keep a log of your results when checking your blood sugar   Our goal bad cholesterol, or LDL, is less than 70 . This is why it is important to continue taking your rosuvastatin.  Elisabeth Delles, PharmD, BCACP [facility] [phone]         

## 2023-08-01 NOTE — Progress Notes (Unsigned)
   08/01/2023  Patient ID: Edward Hurley, male   DOB: 1976/01/02, 47 y.o.   MRN: [id number]  Collaborate with PCP regarding request from patient for prescription for liraglutide (Victoza) to be sent to his pharmacy to determine cost for this medication through his prescription coverage.  Complete prior authorization (PA) for Victoza for patient via covermymeds website. PA approved.  Outreach to [employer] on behalf of patient. Speak with Marylene Land. $464 for generic; brand currently unavailable  Follow up with patient to provide update. Patient states cost of liraglutide/Victoza is unaffordable to him through his high-deductible commercial prescription plan. Patient requests to restart Trulicity.  Plan:  1) Will collaborate with PCP to request provider restart patient on Trulicity, to start at dose of 0.75 mg weekly (as patient has been off of Trulicity/Ozempic for ~1.5 months)  2) Patient to follow up with pharmacy to pick up new glucometer and start keeping a record of blood sugar readings when monitors at home   Follow Up Plan: Clinical Pharmacist will follow up with patient by telephone within the next 30 days  Estelle Grumbles, PharmD, [facility] Clinical Pharmacist [facility] [phone]

## 2023-08-01 NOTE — Addendum Note (Signed)
Addended by: Erasmo Downer on: 08/01/2023 10:36 AM   Modules accepted: Orders

## 2023-08-02 MED ORDER — TRULICITY 0.75 MG/0.5ML ~~LOC~~ SOPN
0.7500 mg | PEN_INJECTOR | SUBCUTANEOUS | 0 refills | Status: AC
Start: 2023-08-02 — End: 2023-08-24

## 2023-08-02 NOTE — Telephone Encounter (Signed)
Requested medications are due for refill today.  yes  Requested medications are on the active medications list.  See note  Last refill. See note  Future visit scheduled.   no  Notes to clinic.  Pharmacy comment: Alternative Requested:PA PLEASSE.     Requested Prescriptions  Pending Prescriptions Disp Refills   ONETOUCH ULTRA TEST test strip [Pharmacy Med Name: ONE TOUCH ULTRA BLUE TEST STRP] 100 strip 12    Sig: Use as instructed to check blood glucose     There is no refill protocol information for this order

## 2023-08-03 ENCOUNTER — Other Ambulatory Visit: Payer: Self-pay | Admitting: Family Medicine

## 2023-08-05 ENCOUNTER — Other Ambulatory Visit: Payer: Self-pay | Admitting: Pharmacist

## 2023-08-05 ENCOUNTER — Encounter: Payer: Self-pay | Admitting: Pharmacist

## 2023-08-05 NOTE — Patient Instructions (Signed)
Goals Addressed             This Visit's Progress    Pharmacy Goals       Our goal A1c is less than 7%. This corresponds with fasting sugars less than 130 and 2 hour after meal sugars less than 180. Please keep a log of your results when checking your blood sugar   Our goal bad cholesterol, or LDL, is less than 70 . This is why it is important to continue taking your rosuvastatin.  Estelle Grumbles, PharmD, [facility] [phone]

## 2023-08-05 NOTE — Progress Notes (Signed)
08/05/2023  Patient ID: Edward Hurley, male   DOB: Apr 13, 1976, 47 y.o.   MRN: [id number]  Collaborated with PCP to request provider restart patient on Trulicity, to start at dose of 0.75 mg weekly (as patient has been off of Trulicity/Ozempic for ~1.5 months)  - [facility]  Complete prior authorization (PA) for Trulicity for patient via covermymeds website. PA approved through 08/01/2024.   Outreach to [employer] on behalf of patient. Provide pharmacy with copay savings card for Trulicity for patient. [facility] through both patient's insurance and copay savings card, but advises that cost is still > $700. [facility] requests that patient bring copay savings card to the pharmacy so that pharmacy can follow up with manufacturer/confirm that they are processing correctly  Follow up with patient to let him know. Advise patient to download a Trulicity savings card from manufacturer website and bring [facility] to request that pharmacy process this after his insurance. Send link to website via [employer].  Patient shares that he did pick up a One Touch glucometer [facility], but has been unable to use it as glucometer was damaged/defective. States pharmacy advised him to contact manufacturer for replacement.  1) Patient to follow up with OneTouch meter manufacturer via website/phone to request replacement glucometer  2) Advise patient to start checking home blood sugar once received, keep log of results and have this record to review at upcoming medical appointments. Patient to contact provider office sooner if needed for readings outside of established parameters or symptoms  Follow Up Plan: Clinical Pharmacist will outreach to patient by telephone on 08/31/2023 at 4:00 PM   Estelle Grumbles, PharmD, [facility] [phone]

## 2023-08-17 ENCOUNTER — Other Ambulatory Visit: Payer: Self-pay | Admitting: Family Medicine

## 2023-08-31 ENCOUNTER — Telehealth: Payer: Self-pay | Admitting: Pharmacist

## 2023-08-31 ENCOUNTER — Other Ambulatory Visit: Payer: Self-pay | Admitting: Pharmacist

## 2023-08-31 NOTE — Progress Notes (Signed)
Outreach Note  08/31/2023 Name: Jahsi Padberg MRN: [id number] DOB: Aug 22, 1976  Referred by: Erasmo Downer, MD  Was unable to reach patient via telephone today and have left HIPAA compliant voicemail asking patient to return my call.    Follow Up Plan: Will attempt to reach patient by telephone again within the next 30 days  Estelle Grumbles, PharmD, [facility] [phone]

## 2023-09-02 ENCOUNTER — Telehealth: Payer: Self-pay | Admitting: Pharmacist

## 2023-09-02 ENCOUNTER — Other Ambulatory Visit: Payer: Self-pay | Admitting: Pharmacist

## 2023-09-02 NOTE — Progress Notes (Signed)
Outreach Note  09/02/2023 Name: Edward Hurley MRN: [id number] DOB: 03/27/1976  Referred by: Erasmo Downer, MD  Was unable to reach patient via telephone today and have left HIPAA compliant voicemail asking patient to return my call. Outreach attempt #2.   Follow Up Plan: Will collaborate with Care Guide to outreach to schedule follow up with me  Estelle Grumbles, PharmD, [facility] [phone]

## 2023-09-07 ENCOUNTER — Telehealth: Payer: Self-pay

## 2023-09-07 NOTE — Progress Notes (Signed)
Care Coordination Note  09/07/2023 Name: Edward Hurley MRN: [id number] DOB: 01/10/1976  Edward Hurley is a 47 y.o. year old male who is a primary care patient of Beryle Flock, [name], MD and is actively engaged with the  Care Management team. I reached out to Edward Hurley by phone today to assist with re-scheduling a follow up visit with the Pharmacist  Follow up plan: Unsuccessful telephone outreach attempt made. A HIPAA compliant phone message was left for the patient providing contact information and requesting a return call.  If patient returns call to provider office, please advise to call Care Guide Edward Hurley  at [phone]  Edward Hurley, RMA Care Guide [facility]  [place], [place] [address] Direct Dial: [phone] [email or web address]

## 2023-09-21 NOTE — Progress Notes (Signed)
Care Coordination Note  09/21/2023 Name: [name] MRN: [id number] DOB: Oct 31, 1976  Edward Hurley is a 47 y.o. year old male who is a primary care patient of Beryle Flock, [name], MD and is actively engaged with the  Care Management team. I reached out to Terri Piedra by phone today to assist with re-scheduling a follow up visit with the Pharmacist  Follow up plan: Unsuccessful telephone outreach attempt made. A HIPAA compliant phone message was left for the patient providing contact information and requesting a return call.   Penne Lash, RMA Care Guide [facility]  [place], [place] [address] Direct Dial: [phone] [email or web address]

## 2023-09-29 ENCOUNTER — Other Ambulatory Visit: Payer: Self-pay | Admitting: Family Medicine

## 2023-09-29 NOTE — Telephone Encounter (Signed)
Requested medications are due for refill today.  yes  Requested medications are on the active medications list.  yes  Last refill. 08/19/2023 #15 1 rf  Future visit scheduled.   no  Notes to clinic.  Refill not delegated.    Requested Prescriptions  Pending Prescriptions Disp Refills   clonazePAM (KLONOPIN) 0.5 MG tablet [Pharmacy Med Name: CLONAZEPAM 0.5 MG TABLET] 15 tablet 1    Sig: TAKE 1 TABLET BY MOUTH TWICE A DAY AS NEEDED FOR ANXIETY     Not Delegated - Psychiatry: Anxiolytics/Hypnotics 2 Failed - 09/29/2023  1:26 PM      Failed - This refill cannot be delegated      Failed - Urine Drug Screen completed in last 360 days      Failed - Valid encounter within last 6 months    Recent Outpatient Visits           8 months ago Hypertension associated with diabetes [facility])   [place] [facility] Heil, [name], MD   12 months ago Type 2 diabetes mellitus without complication, without long-term current use of insulin [facility])   [place] [facility] Speed, [name], MD   1 year ago Type 2 diabetes mellitus without complication, without long-term current use of insulin [facility])   [place] [facility] [place], [name], MD   1 year ago GAD (generalized anxiety disorder)   [place] [facility] Simmons-Robinson, [place], MD   1 year ago Type 2 diabetes mellitus without complication, without long-term current use of insulin [facility])    [facility] [facility] [place], [name], MD              Passed - Patient is not pregnant

## 2023-10-04 NOTE — Progress Notes (Signed)
  Care Coordination Note  10/04/2023 Name: Edward Hurley MRN: [id number] DOB: [birth year]  Edward Hurley is a 47 y.o. year old male who is a primary care patient of Beryle Flock, [name], MD and is actively engaged with the Chronic Care Management team. I reached out to Terri Piedra by phone today to assist with re-scheduling a follow up visit with the Pharmacist  Follow up plan: Unable to make contact on outreach attempts x 3. PCP Bacigalupo, [name], MD notified via routed documentation in medical record.   Penne Lash, RMA Care Guide [facility]  [place], [place] [address] Direct Dial: [phone] [email or web address]

## 2023-10-05 ENCOUNTER — Other Ambulatory Visit: Payer: Self-pay | Admitting: Family Medicine

## 2023-10-06 NOTE — Telephone Encounter (Signed)
Requested Prescriptions  Refused Prescriptions Disp Refills   sertraline (ZOLOFT) 100 MG tablet [Pharmacy Med Name: SERTRALINE HCL 100 MG TABLET] 180 tablet 4    Sig: TAKE 2 TABLETS BY MOUTH EVERY DAY     Psychiatry:  Antidepressants - SSRI - sertraline Failed - 10/05/2023  8:32 AM      Failed - Valid encounter within last 6 months    Recent Outpatient Visits           9 months ago Hypertension associated with diabetes [facility])   [place] [facility] [place], [name], MD   1 year ago Type 2 diabetes mellitus without complication, without long-term current use of insulin [facility])   [place] [facility] [place], [name], MD   1 year ago Type 2 diabetes mellitus without complication, without long-term current use of insulin [facility])   [place] [place], [name], MD   1 year ago GAD (generalized anxiety disorder)   [place] [facility] Simmons-Robinson, [place], MD   1 year ago Type 2 diabetes mellitus without complication, without long-term current use of insulin [facility])   [place] [facility] Uncertain, [name], MD              Passed - AST in normal range and within 360 days    AST  Date Value Ref Range Status  01/07/2023 23 0 - 40 IU/L Final         Passed - ALT in normal range and within 360 days    ALT  Date Value Ref Range Status  01/07/2023 36 0 - 44 IU/L Final         Passed - Completed PHQ-2 or PHQ-9 in the last 360 days

## 2023-11-08 ENCOUNTER — Other Ambulatory Visit: Payer: Self-pay | Admitting: Family Medicine

## 2023-11-08 NOTE — Telephone Encounter (Signed)
Requested Prescriptions  Refused Prescriptions Disp Refills   sertraline (ZOLOFT) 100 MG tablet [Pharmacy Med Name: SERTRALINE HCL 100 MG TABLET] 180 tablet 4    Sig: TAKE 2 TABLETS BY MOUTH EVERY DAY     Psychiatry:  Antidepressants - SSRI - sertraline Failed - 11/08/2023  5:50 PM      Failed - Valid encounter within last 6 months    Recent Outpatient Visits           10 months ago Hypertension associated with diabetes [facility])   [place] [place], [name], MD   1 year ago Type 2 diabetes mellitus without complication, without long-term current use of insulin [facility])   [place] [facility] [place], [name], MD   1 year ago Type 2 diabetes mellitus without complication, without long-term current use of insulin [facility])   [place] [facility] [place], [name], MD   1 year ago GAD (generalized anxiety disorder)   [place] [facility] Simmons-Robinson, [place], MD   1 year ago Type 2 diabetes mellitus without complication, without long-term current use of insulin [facility])   [place] [facility] [place], [name], MD              Passed - AST in normal range and within 360 days    AST  Date Value Ref Range Status  01/07/2023 23 0 - 40 IU/L Final         Passed - ALT in normal range and within 360 days    ALT  Date Value Ref Range Status  01/07/2023 36 0 - 44 IU/L Final         Passed - Completed PHQ-2 or PHQ-9 in the last 360 days

## 2023-11-09 ENCOUNTER — Other Ambulatory Visit: Payer: Self-pay | Admitting: Family Medicine

## 2023-11-09 ENCOUNTER — Encounter: Payer: Self-pay | Admitting: Family Medicine

## 2023-11-09 NOTE — Telephone Encounter (Signed)
Requested medication (s) are due for refill today - yes  Requested medication (s) are on the active medication list -yes  Future visit scheduled -yes  Last refill: 09/30/23 #15 1RF  Notes to clinic: non delegated Rx  Requested Prescriptions  Pending Prescriptions Disp Refills   clonazePAM (KLONOPIN) 0.5 MG tablet [Pharmacy Med Name: CLONAZEPAM 0.5 MG TABLET] 15 tablet 1    Sig: TAKE 1 TABLET BY MOUTH TWICE A DAY AS NEEDED FOR ANXIETY     Not Delegated - Psychiatry: Anxiolytics/Hypnotics 2 Failed - 11/09/2023  3:44 PM      Failed - This refill cannot be delegated      Failed - Urine Drug Screen completed in last 360 days      Failed - Valid encounter within last 6 months    Recent Outpatient Visits           10 months ago Hypertension associated with diabetes [facility])   [place] [facility] [place], [name], MD   1 year ago Type 2 diabetes mellitus without complication, without long-term current use of insulin [facility])   [place] [facility] [place], [name], MD   1 year ago Type 2 diabetes mellitus without complication, without long-term current use of insulin [facility])   [place] [facility] [place], [name], MD   1 year ago GAD (generalized anxiety disorder)   [place] [facility] Simmons-Robinson, [place], MD   1 year ago Type 2 diabetes mellitus without complication, without long-term current use of insulin (HCC)   [place] [facility] [place], [name], MD       Future Appointments             In 3 weeks Bacigalupo, [name], MD [facility]            Passed - Patient is not pregnant         Requested Prescriptions  Pending Prescriptions Disp Refills   clonazePAM (KLONOPIN) 0.5 MG tablet [Pharmacy Med Name: CLONAZEPAM 0.5 MG TABLET] 15 tablet 1    Sig: TAKE 1 TABLET BY MOUTH TWICE A DAY AS NEEDED FOR ANXIETY     Not Delegated -  Psychiatry: Anxiolytics/Hypnotics 2 Failed - 11/09/2023  3:44 PM      Failed - This refill cannot be delegated      Failed - Urine Drug Screen completed in last 360 days      Failed - Valid encounter within last 6 months    Recent Outpatient Visits           10 months ago Hypertension associated with diabetes [facility])   [place] [facility] [place], [name], MD   1 year ago Type 2 diabetes mellitus without complication, without long-term current use of insulin [facility])   [place] [facility] [place], [name], MD   1 year ago Type 2 diabetes mellitus without complication, without long-term current use of insulin [facility])   [place] [facility] [place], [name], MD   1 year ago GAD (generalized anxiety disorder)   [place] [facility] Simmons-Robinson, [place], MD   1 year ago Type 2 diabetes mellitus without complication, without long-term current use of insulin [facility])   [place] [facility] Bacigalupo, [name], MD       Future Appointments             In 3 weeks Bacigalupo, [name], MD  [place] [facility], [facility]            Passed - Patient is not pregnant

## 2023-12-02 ENCOUNTER — Encounter: Payer: Self-pay | Admitting: Family Medicine

## 2023-12-02 ENCOUNTER — Telehealth (INDEPENDENT_AMBULATORY_CARE_PROVIDER_SITE_OTHER): Payer: Self-pay | Admitting: Family Medicine

## 2023-12-02 ENCOUNTER — Telehealth: Payer: Self-pay | Admitting: Family Medicine

## 2023-12-02 DIAGNOSIS — E785 Hyperlipidemia, unspecified: Secondary | ICD-10-CM

## 2023-12-02 DIAGNOSIS — E1129 Type 2 diabetes mellitus with other diabetic kidney complication: Secondary | ICD-10-CM

## 2023-12-02 DIAGNOSIS — F411 Generalized anxiety disorder: Secondary | ICD-10-CM

## 2023-12-02 DIAGNOSIS — R809 Proteinuria, unspecified: Secondary | ICD-10-CM

## 2023-12-02 DIAGNOSIS — E1169 Type 2 diabetes mellitus with other specified complication: Secondary | ICD-10-CM

## 2023-12-02 DIAGNOSIS — E669 Obesity, unspecified: Secondary | ICD-10-CM

## 2023-12-02 DIAGNOSIS — E890 Postprocedural hypothyroidism: Secondary | ICD-10-CM

## 2023-12-02 DIAGNOSIS — I152 Hypertension secondary to endocrine disorders: Secondary | ICD-10-CM

## 2023-12-02 DIAGNOSIS — E66811 Obesity, class 1: Secondary | ICD-10-CM

## 2023-12-02 DIAGNOSIS — E1159 Type 2 diabetes mellitus with other circulatory complications: Secondary | ICD-10-CM

## 2023-12-02 MED ORDER — TIRZEPATIDE 2.5 MG/0.5ML ~~LOC~~ SOAJ: 2.5000 mg | SUBCUTANEOUS | 0 refills | Status: AC

## 2023-12-02 MED ORDER — TIRZEPATIDE 5 MG/0.5ML ~~LOC~~ SOAJ: 5.0000 mg | SUBCUTANEOUS | 0 refills | Status: DC

## 2023-12-02 MED ORDER — ONETOUCH ULTRA 2 W/DEVICE KIT: PACK | 0 refills | Status: AC

## 2023-12-02 MED ORDER — CLONAZEPAM 0.5 MG PO TABS: 0.5000 mg | ORAL_TABLET | Freq: Two times a day (BID) | ORAL | 2 refills | Status: DC | PRN

## 2023-12-02 NOTE — Assessment & Plan Note (Signed)
 Currently managed with losartan 100 mg daily. - Continue losartan 100 mg daily

## 2023-12-02 NOTE — Assessment & Plan Note (Signed)
 Currently managed with Crestor 5 mg daily. - Continue Crestor 5 mg daily - Order lipid panel

## 2023-12-02 NOTE — Progress Notes (Signed)
 MyChart Video Visit    Virtual Visit via Video Note   This format is felt to be most appropriate for this patient at this time. Physical exam was limited by quality of the video and audio technology used for the visit.    Patient location: home Provider location: [facility] Persons involved in the visit: patient, provider  I discussed the limitations of evaluation and management by telemedicine and the availability of in person appointments. The patient expressed understanding and agreed to proceed.  Patient: Edward Hurley   DOB: 1976/10/01   48 y.o. Male  MRN: [id number] Visit Date: 12/02/2023  Today's healthcare provider: Jon Eva, MD   Chief Complaint  Patient presents with   Follow-up    Pt stated--have different insurance this year, possible ozempic  will be covered. Medication refill   Subjective    HPI HPI     Follow-up    Additional comments: Pt stated--have different insurance this year, possible ozempic  will be covered. Medication refill      Last edited by Deitra Therisa HERO, CMA on 12/02/2023 10:44 AM.       Discussed the use of AI scribe software for clinical note transcription with the patient, who gave verbal consent to proceed.  History of Present Illness   A 48 year old patient with a history of hypertension, type two diabetes, hyperlipidemia, microalbuminuria, post ablative hypothyroidism, and generalized anxiety disorder presents for a follow-up visit. The patient reports having been off his diabetes medication for the past few months and is eager to restart treatment. He has been adhering to his blood pressure and thyroid  medications. He previously tried metformin  for his diabetes but experienced significant gastrointestinal side effects.  In addition to his physical health concerns, the patient has been dealing with increased stress and depression. He reports feeling more tired than usual and needing to nap more often. He  attributes this to a series of stressful life events, including his daughter going off to college, having to move house quickly due to his landlord deciding to sell, and job-related stress. He has been considering seeking therapy to help manage his anxiety and depression.  The patient also mentions having issues with his glucometer, which he received from the pharmacy but found to be broken. He has all the necessary supplies, such as test strips, but is unable to use them due to the broken glucometer.       Review of Systems      Objective    There were no vitals taken for this visit.      Physical Exam Constitutional:      General: He is not in acute distress.    Appearance: Normal appearance. He is not diaphoretic.  HENT:     Head: Normocephalic.  Eyes:     Conjunctiva/sclera: Conjunctivae normal.  Pulmonary:     Effort: Pulmonary effort is normal. No respiratory distress.  Neurological:     Mental Status: He is alert and oriented to person, place, and time. Mental status is at baseline.        Assessment & Plan     Problem List Items Addressed This Visit       Cardiovascular and Mediastinum   Hypertension associated with diabetes (HCC)   Currently managed with losartan  100 mg daily. - Continue losartan  100 mg daily      Relevant Medications   tirzepatide  (MOUNJARO ) 2.5 MG/0.5ML Pen   tirzepatide  (MOUNJARO ) 5 MG/0.5ML Pen   Other Relevant Orders  Comprehensive metabolic panel     Endocrine   Postablative hypothyroidism   Currently managed with Synthroid  224 mcg daily, except 336 mcg on Mondays. Reports increased sleepiness and napping, which may be related to thyroid  function or other stressors. - Order thyroid  function tests - Continue Synthroid  224 mcg daily, except 336 mcg on Mondays      Relevant Orders   TSH   Type 2 diabetes mellitus (HCC) - Primary   Off diabetes medication for the last few months due to insurance issues. Previously on  metformin  but experienced significant gastrointestinal side effects. Discussed options for GLP-1 receptor agonists, including Trulicity , Ozempic , and Mounjaro . Mounjaro  preferred due to superior weight loss and A1c reduction data, as well as better tolerability. Initial dose is 2.5 mg weekly for one month, then 5 mg weekly for two months, with potential to increase up to 15 mg based on tolerability and need. - Send prescription for Mounjaro  2.5 mg weekly for one month, then 5 mg weekly for two months - Order prior authorization for Mounjaro  - Order A1c, kidney function, liver function, and urine microalbumin tests - Follow up in three months to reassess diabetes management and A1c levels      Relevant Medications   tirzepatide  (MOUNJARO ) 2.5 MG/0.5ML Pen   tirzepatide  (MOUNJARO ) 5 MG/0.5ML Pen   Other Relevant Orders   Hemoglobin A1c   Hyperlipidemia associated with type 2 diabetes mellitus (HCC)   Currently managed with Crestor  5 mg daily. - Continue Crestor  5 mg daily - Order lipid panel      Relevant Medications   tirzepatide  (MOUNJARO ) 2.5 MG/0.5ML Pen   tirzepatide  (MOUNJARO ) 5 MG/0.5ML Pen   Other Relevant Orders   Comprehensive metabolic panel   Lipid panel   Microalbuminuria due to type 2 diabetes mellitus (HCC)   Continue losartan       Relevant Medications   tirzepatide  (MOUNJARO ) 2.5 MG/0.5ML Pen   tirzepatide  (MOUNJARO ) 5 MG/0.5ML Pen   Other Relevant Orders   Microalbumin / creatinine urine ratio     Other   Obesity (BMI 30.0-34.9)   Discussed importance of healthy weight management Discussed diet and exercise       GAD (generalized anxiety disorder)   Currently managed with Klonopin  0.5 mg BID PRN and Zoloft  200 mg daily. Reports increased anxiety and depressive symptoms due to recent life stressors. Discussed potential augmentation of sertraline  with Buspar  or Wellbutrin . Considering therapy. No suicidal ideation but significant stressors impacting mental  health. - Continue Klonopin  0.5 mg BID PRN - Continue Zoloft  200 mg daily - Discuss therapy options and provide referrals to [facility], [facility], and [facility] - Reassess anxiety and depression management at next visit           General Health Maintenance Due for annual labs. Discussed importance of regular monitoring. - Order comprehensive lab panel including A1c, thyroid  function, lipid panel, kidney function, liver function, and urine microalbumin  Follow-up - Schedule follow-up visit in three months - Send prescription for glucometer to pharmacy.       Meds ordered this encounter  Medications   clonazePAM  (KLONOPIN ) 0.5 MG tablet    Sig: Take 1 tablet (0.5 mg total) by mouth 2 (two) times daily as needed for anxiety.    Dispense:  15 tablet    Refill:  2    Do not fill <30 days from last refill   tirzepatide  (MOUNJARO ) 2.5 MG/0.5ML Pen    Sig: Inject 2.5 mg into the skin once a week  for 28 days.    Dispense:  2 mL    Refill:  0   tirzepatide  (MOUNJARO ) 5 MG/0.5ML Pen    Sig: Inject 5 mg into the skin once a week.    Dispense:  6 mL    Refill:  0   Blood Glucose Monitoring Suppl (ONE TOUCH ULTRA 2) w/Device KIT    Sig: Use as directed    Dispense:  1 kit    Refill:  0     Return in about 3 months (around 03/01/2024) for chronic disease f/u.     I discussed the assessment and treatment plan with the patient. The patient was provided an opportunity to ask questions and all were answered. The patient agreed with the plan and demonstrated an understanding of the instructions.   The patient was advised to call back or seek an in-person evaluation if the symptoms worsen or if the condition fails to improve as anticipated.   Jon Eva, MD [facility] [phone] (phone) [phone] (fax)  [facility]

## 2023-12-02 NOTE — Assessment & Plan Note (Signed)
 Currently managed with Synthroid 224 mcg daily, except 336 mcg on Mondays. Reports increased sleepiness and napping, which may be related to thyroid function or other stressors. - Order thyroid function tests - Continue Synthroid 224 mcg daily, except 336 mcg on Mondays

## 2023-12-02 NOTE — Assessment & Plan Note (Signed)
 Discussed importance of healthy weight management Discussed diet and exercise

## 2023-12-02 NOTE — Assessment & Plan Note (Signed)
 Currently managed with Klonopin  0.5 mg BID PRN and Zoloft  200 mg daily. Reports increased anxiety and depressive symptoms due to recent life stressors. Discussed potential augmentation of sertraline  with Buspar  or Wellbutrin . Considering therapy. No suicidal ideation but significant stressors impacting mental health. - Continue Klonopin  0.5 mg BID PRN - Continue Zoloft  200 mg daily - Discuss therapy options and provide referrals to [facility], [facility], and [facility] - Reassess anxiety and depression management at next visit

## 2023-12-02 NOTE — Telephone Encounter (Signed)
 Covermymeds is requesting prior authorization Key: [id number] Mounjaro 2.5mg /0.68ml auto injectors

## 2023-12-02 NOTE — Assessment & Plan Note (Signed)
 Off diabetes medication for the last few months due to insurance issues. Previously on metformin  but experienced significant gastrointestinal side effects. Discussed options for GLP-1 receptor agonists, including Trulicity , Ozempic , and Mounjaro . Mounjaro  preferred due to superior weight loss and A1c reduction data, as well as better tolerability. Initial dose is 2.5 mg weekly for one month, then 5 mg weekly for two months, with potential to increase up to 15 mg based on tolerability and need. - Send prescription for Mounjaro  2.5 mg weekly for one month, then 5 mg weekly for two months - Order prior authorization for Mounjaro  - Order A1c, kidney function, liver function, and urine microalbumin tests - Follow up in three months to reassess diabetes management and A1c levels

## 2023-12-02 NOTE — Assessment & Plan Note (Signed)
 Continue losartan.

## 2023-12-06 ENCOUNTER — Telehealth: Payer: Self-pay | Admitting: Family Medicine

## 2023-12-06 NOTE — Telephone Encounter (Signed)
 Covermymeds is requesting prior authorization Key: [id number] Mounjaro 5mg /0.52ml auto injectors

## 2023-12-06 NOTE — Telephone Encounter (Signed)
 PA submitted.

## 2023-12-09 ENCOUNTER — Other Ambulatory Visit: Payer: Self-pay | Admitting: Family Medicine

## 2023-12-09 NOTE — Telephone Encounter (Signed)
Requested medication (s) are due for refill today: alternative requested  Requested medication (s) are on the active medication list: yes  Last refill:  12/08/22  Future visit scheduled: no  Notes to clinic:  Pharmacy comment: Alternative Requested:PRIOR AUTH DENIED.      Requested Prescriptions  Pending Prescriptions Disp Refills   MOUNJARO 2.5 MG/0.5ML Pen [Pharmacy Med Name: MOUNJARO 2.5 MG/0.5 ML PEN]  0    Sig: INJECT 2.5 MG INTO THE SKIN ONCE A WEEK FOR 28 DAYS.     Off-Protocol Failed - 12/09/2023  3:31 PM      Failed - Medication not assigned to a protocol, review manually.      Passed - Valid encounter within last 12 months    Recent Outpatient Visits           1 week ago Type 2 diabetes mellitus with diabetic microalbuminuria, without long-term current use of insulin [facility])   [place] [facility] [place], [name], MD   11 months ago Hypertension associated with diabetes [facility])   [place] [facility] [place], [name], MD   1 year ago Type 2 diabetes mellitus without complication, without long-term current use of insulin [facility])   [place] [facility] [place], [name], MD   1 year ago Type 2 diabetes mellitus without complication, without long-term current use of insulin [facility])   [facility] [place], [name], MD   1 year ago GAD (generalized anxiety disorder)    [facility] Ronnald Ramp, MD

## 2023-12-14 NOTE — Telephone Encounter (Signed)
Notes faxed to Plan to be review

## 2023-12-27 NOTE — Telephone Encounter (Signed)
 Information regarding your request  Your PA has been resolved, no additional PA is required. For further inquiries please contact the number on the back of the member prescription card. (Message [phone])

## 2024-01-09 ENCOUNTER — Other Ambulatory Visit: Payer: Self-pay | Admitting: Family Medicine

## 2024-01-09 ENCOUNTER — Ambulatory Visit: Payer: Self-pay

## 2024-01-09 MED ORDER — LANTUS SOLOSTAR 100 UNIT/ML ~~LOC~~ SOPN: 10.0000 [IU] | PEN_INJECTOR | Freq: Every day | SUBCUTANEOUS | 1 refills | Status: DC

## 2024-01-09 MED ORDER — TRESIBA FLEXTOUCH 100 UNIT/ML ~~LOC~~ SOPN: 10.0000 [IU] | PEN_INJECTOR | Freq: Every day | SUBCUTANEOUS | 1 refills | Status: DC

## 2024-01-09 NOTE — Telephone Encounter (Addendum)
Patient called back to [facility] and call was lost in transfer. I called him back. He states that he received notice from [facility] that there is a problem with his Lantus and he is not sure what it means.  I have placed a call to [facility] to find out what the problem is and the pharmacist states insurance is preferring [place] or Hospital doctor.   Patient reports still feeling excessively fatigued. He has slept for several hours today. Last glucose reading was 344.

## 2024-01-09 NOTE — Telephone Encounter (Signed)
I called patient and recommended ER evaluation now for his elevated glucose and symptoms. Patient is hesitant and states he has no car today and his child is home from school. I again reiterated that the ER evaluation/treatment is upmost importance right now. He verbalized understanding and wanted me to ask Dr. B if there are alternatives to him going to ER.

## 2024-01-09 NOTE — Telephone Encounter (Addendum)
  Chief Complaint: BS 431, 470 Symptoms: blurred vision, eyes feel heavy and tired Frequency: this am  Pertinent Negatives: Patient denies freq. Urination, dry mouth Disposition: [] ED /[] Urgent Care (no appt availability in office) / [] Appointment(In office/virtual)/ []  [place] [facility]/ [] Home Care/ [] Refused Recommended Disposition /[] [facility]/ [x]  Follow-up with PCP Additional Notes: advised pt to begin drinking water and may eat egg but no cereal, sweet breakfast food. Pt is not on any meds for his diabetes. Reason for Disposition  Blood glucose > 400 mg/dL (78.2 mmol/L)  Answer Assessment - Initial Assessment Questions 1. BLOOD GLUCOSE: "What is your blood glucose level?"      431 2. ONSET: "When did you check the blood glucose?"     0815 3. USUAL RANGE: "What is your glucose level usually?" (e.g., usual fasting morning value, usual evening value)     Not enough [id number] 4. KETONES: "Do you check for ketones (urine or blood test strips)?" If Yes, ask: "What does the test show now?"      no 5. TYPE 1 or 2:  "Do you know what type of diabetes you have?"  (e.g., Type 1, Type 2, Gestational; doesn't know)      Type 2 6. INSULIN: "Do you take insulin?" "What type of insulin(s) do you use? What is the mode of delivery? (syringe, pen; injection or pump)?"     no 7. DIABETES PILLS: "Do you take any pills for your diabetes?" If Yes, ask: "Have you missed taking any pills recently?"     no 8. OTHER SYMPTOMS: "Do you have any symptoms?" (e.g., fever, frequent urination, difficulty breathing, dizziness, weakness, vomiting)     Blurred vision - extra tired and blurry, eyes feel heavy  Protocols used: Diabetes - High Blood Sugar-A-AH

## 2024-01-09 NOTE — Telephone Encounter (Signed)
New order for Tresiba sent to pharmacy. Patient notified and verbalized understanding.

## 2024-01-09 NOTE — Telephone Encounter (Signed)
If patient unable to go to ED. Needs to avoid sugar intake. We have had difficulty getting meds for him. Can start Lantus 10 units daily (please send Rx) and continue to monitor BG today. If worsening or develops confusion, N/V needs to go to ED.

## 2024-01-09 NOTE — Addendum Note (Signed)
Addended by: Erasmo Downer on: 01/09/2024 09:39 AM   Modules accepted: Orders

## 2024-01-09 NOTE — Addendum Note (Signed)
Addended by: Erasmo Downer on: 01/09/2024 05:02 PM   Modules accepted: Orders

## 2024-01-09 NOTE — Addendum Note (Signed)
Addended by: Shirley Muscat on: 01/09/2024 09:37 AM   Modules accepted: Orders

## 2024-01-09 NOTE — Telephone Encounter (Signed)
Patient is agreeable with this plan. He will continue to monitor his BG and his symptoms. He will seek emergent treatment if he develops confusion or N/V in addition to current symptoms worsening.   He believes he can get to his pharmacy to get his prescription today.

## 2024-01-10 NOTE — Telephone Encounter (Signed)
Requested medication (s) are due for refill today:   Requested medication (s) are on the active medication list: No  Last refill:    Future visit scheduled: No  Notes to clinic:  See pharmacy request.    Requested Prescriptions  Pending Prescriptions Disp Refills   insulin glargine-yfgn (SEMGLEE) 100 UNIT/ML Pen [Pharmacy Med Name: INSULIN GLARGINE-YFGN U100 PEN]  1    Sig: INJECT 10 UNITS INTO THE SKIN DAILY     There is no refill protocol information for this order

## 2024-01-11 ENCOUNTER — Ambulatory Visit: Payer: Self-pay | Admitting: Family Medicine

## 2024-01-11 NOTE — Telephone Encounter (Signed)
Copied from CRM [id number]. Topic: Clinical - Red Word Triage >> Jan 11, 2024  2:54 PM Albin Felling L wrote: Red Word that prompted transfer to Nurse Triage: Pt calling back to schedule appointment due to worsening symptoms that were addressed. Pt concerned about diabetes and anxiety/depression.   Chief Complaint: Anxiety Symptoms: recent elevated blood sugars that he spoke to his doctor about and was following up with medication and bloodwork, increasing anxiety/depression symptoms Frequency: years but worse in the past few weeks with the anxiety symptoms Pertinent Negatives: Patient denies rouble sleeping, trouble breathing, palpitations or fast heartbeat, chest pain, sweating, or diarrhea Disposition: [] ED /[] Urgent Care (no appt availability in office) / [x] Appointment(In office/virtual)/ []  [place] [facility]/ [] Home Care/ [] Refused Recommended Disposition /[] [place] [facility]/ []  Follow-up with PCP Additional Notes: Patient called and advised that he had some issues with his blood sugar levels elevated two days ago that he spoke with the office about already and also wanted to get some other things taken care of. He states that he has a hernia that he needs to have a hernia take care of and a colonoscopy that he needs to get done. Patient also states that he needs to get some lab work done as well he states. Patient also states that he has been experiencing some anxiety/depression symptoms.  He is taking a leave of absence to take care of this. Patient states that he may need some paperwork for taking a leave of absence.  Patient also states that he is not a danger to himself or anyone else.  He is just experiencing increased symptoms of anxiety and feeling depressed and wants to speak with his PCP about getting this under control.  He also states that he  Appointment is made for 01/17/2024 with patient's PCP at 3pm.  Patient is also advised that if anything worsens or he needs anything he can go  to the emergency room.  Patient verbalized understanding and is appreciative of getting the appointment set up.    Reason for Disposition  [1] Symptoms of anxiety or panic attack AND [2] is a chronic symptom (recurrent or ongoing AND present > 4 weeks)  Answer Assessment - Initial Assessment Questions 1. CONCERN: "Did anything happen that prompted you to call today?"      Pt wanted to get multiple things taken care of this week 2. ANXIETY SYMPTOMS: "Can you describe how you (your loved one; patient) have been feeling?" (e.g., tense, restless, panicky, anxious, keyed up, overwhelmed, sense of impending doom).      "Hit or miss, last week was worse, down on myself, frustrated or worthlessness" 3. ONSET: "How long have you been feeling this way?" (e.g., hours, days, weeks)     "I stay anxious and I feel guilty for taking a leave of absence to take care of myself" 4. SEVERITY: "How would you rate the level of anxiety?" (e.g., 0 - 10; or mild, moderate, severe).     "7  it fluctuates" 5. FUNCTIONAL IMPAIRMENT: "How have these feelings affected your ability to do daily activities?" "Have you had more difficulty than usual doing your normal daily activities?" (e.g., getting better, same, worse; self-care, school, work, interactions)     "It affects work some. I work from home." 6. HISTORY: "Have you felt this way before?" "Have you ever been diagnosed with an anxiety problem in the past?" (e.g., generalized anxiety disorder, panic attacks, PTSD). If Yes, ask: "How was this problem treated?" (e.g., medicines, counseling, etc.)  Yes--but it is worse lately 7. RISK OF HARM - SUICIDAL IDEATION: "Do you ever have thoughts of hurting or killing yourself?" If Yes, ask:  "Do you have these feelings now?" "Do you have a plan on how you would do this?"     No 8. TREATMENT:  "What has been done so far to treat this anxiety?" (e.g., medicines, relaxation strategies). "What has helped?"     Pt is on Zoloft and  has another medicine as needed. I have been super tired lately 9. TREATMENT - THERAPIST: "Do you have a counselor or therapist? Name?"     No but I am looking into that from a suggestion 10. POTENTIAL TRIGGERS: "Do you drink caffeinated beverages (e.g., coffee, colas, teas), and how much daily?" "Do you drink alcohol or use any drugs?" "Have you started any new medicines recently?"       "I drink soda but less...mostly coke zero" 11. PATIENT SUPPORT: "Who is with you now?" "Who do you live with?" "Do you have family or friends who you can talk to?"        [place] a son and daughter--wife is support 19. OTHER SYMPTOMS: "Do you have any other symptoms?" (e.g., feeling depressed, trouble concentrating, trouble sleeping, trouble breathing, palpitations or fast heartbeat, chest pain, sweating, nausea, or diarrhea)       Feeling depressed, trouble concentrating,  Protocols used: Anxiety and Panic Attack-A-AH

## 2024-01-17 ENCOUNTER — Ambulatory Visit: Payer: 59 | Admitting: Family Medicine

## 2024-01-17 ENCOUNTER — Other Ambulatory Visit: Payer: Self-pay | Admitting: Family Medicine

## 2024-01-17 ENCOUNTER — Encounter: Payer: Self-pay | Admitting: Family Medicine

## 2024-01-17 ENCOUNTER — Telehealth: Payer: Self-pay | Admitting: Family Medicine

## 2024-01-17 VITALS — BP 159/115 | HR 68 | Ht 72.0 in | Wt 255.8 lb

## 2024-01-17 DIAGNOSIS — R809 Proteinuria, unspecified: Secondary | ICD-10-CM | POA: Diagnosis not present

## 2024-01-17 DIAGNOSIS — E1169 Type 2 diabetes mellitus with other specified complication: Secondary | ICD-10-CM

## 2024-01-17 DIAGNOSIS — E1159 Type 2 diabetes mellitus with other circulatory complications: Secondary | ICD-10-CM | POA: Diagnosis not present

## 2024-01-17 DIAGNOSIS — I152 Hypertension secondary to endocrine disorders: Secondary | ICD-10-CM

## 2024-01-17 DIAGNOSIS — Z1211 Encounter for screening for malignant neoplasm of colon: Secondary | ICD-10-CM

## 2024-01-17 DIAGNOSIS — Z23 Encounter for immunization: Secondary | ICD-10-CM

## 2024-01-17 DIAGNOSIS — F411 Generalized anxiety disorder: Secondary | ICD-10-CM

## 2024-01-17 DIAGNOSIS — E1129 Type 2 diabetes mellitus with other diabetic kidney complication: Secondary | ICD-10-CM | POA: Diagnosis not present

## 2024-01-17 DIAGNOSIS — Z7984 Long term (current) use of oral hypoglycemic drugs: Secondary | ICD-10-CM

## 2024-01-17 DIAGNOSIS — E890 Postprocedural hypothyroidism: Secondary | ICD-10-CM

## 2024-01-17 DIAGNOSIS — Z720 Tobacco use: Secondary | ICD-10-CM | POA: Diagnosis not present

## 2024-01-17 DIAGNOSIS — K429 Umbilical hernia without obstruction or gangrene: Secondary | ICD-10-CM

## 2024-01-17 DIAGNOSIS — E785 Hyperlipidemia, unspecified: Secondary | ICD-10-CM

## 2024-01-17 MED ORDER — EMPAGLIFLOZIN 25 MG PO TABS: 25.0000 mg | ORAL_TABLET | Freq: Every day | ORAL | 1 refills | Status: DC

## 2024-01-17 NOTE — Assessment & Plan Note (Signed)
 Hypertension noted with elevated blood pressure readings. Jardiance may help lower blood pressure slightly. - Monitor blood pressure regularly - Continue current antihypertensive medications

## 2024-01-17 NOTE — Assessment & Plan Note (Signed)
 Reports significant anxiety and stress related to multiple health issues and family concerns, including his father's upcoming open-heart surgery. Discussed the importance of managing stress and anxiety. - Consider referral to mental health services if anxiety persists

## 2024-01-17 NOTE — Assessment & Plan Note (Signed)
 Currently managed with Crestor 5 mg daily. - Continue Crestor 5 mg daily - Order lipid panel

## 2024-01-17 NOTE — Assessment & Plan Note (Signed)
 Continue losartan.

## 2024-01-17 NOTE — Assessment & Plan Note (Signed)
 Umbilical hernia present for 2.5-3 years, causing discomfort. Surgery has been delayed due to other health concerns. Discussed potential delay in surgery due to A1c levels. - Resend referral to surgeon for hernia repair - Schedule hernia surgery after colonoscopy

## 2024-01-17 NOTE — Assessment & Plan Note (Signed)
 Type 2 Diabetes Mellitus with recent blood glucose readings improving from over 400 mg/dL to 409-811 mg/dL. Currently on Tresiba (insulin) 10 units daily. Metformin was discontinued due to GI side effects. GLP-1 agonists are not affordable due to high insurance copays. Jardiance (empagliflozin) was discussed as an alternative due to its benefits in lowering A1c, kidney protection, and cardiovascular protection. Risks include potential urinary tract infections and yeast infections. Emphasized the importance of obtaining a Jardiance copay card to reduce costs. - Continue Tresiba 10 units daily - Start Jardiance 25 mg daily - Obtain Jardiance copay card before pharmacy visit - Monitor blood glucose levels fasting and 2 hours postprandial - Follow up in 4-6 weeks to reassess blood glucose control - Order urine test for kidney disease marker - Order lab work for diabetes management

## 2024-01-17 NOTE — Telephone Encounter (Signed)
 Copied from CRM [id number]. Topic: General - Other >> Jan 17, 2024  2:54 PM Antwanette L wrote: Reason for CRM: Summer from [employer] is calling to get some medical information about the patient. The representative wants to know when the patient was diagnosed with diabetes and also needs to verify something other medical information. The information is for a short term disability claim. Summer can be reached at [phone] and mention the reference claim number [id number]

## 2024-01-17 NOTE — Assessment & Plan Note (Signed)
 Currently managed with Synthroid 224 mcg daily, except 336 mcg on Mondays. Reports increased sleepiness and napping, which may be related to thyroid function or other stressors. - Order thyroid function tests - Continue Synthroid 224 mcg daily, except 336 mcg on Mondays

## 2024-01-17 NOTE — Progress Notes (Signed)
 Established patient visit   Patient: Edward Hurley   DOB: Jun 20, 1976   48 y.o. Male  MRN: [id number] Visit Date: 01/17/2024  Today's healthcare provider: Shirlee Latch, MD   Chief Complaint  Patient presents with   Follow-up   Diabetes    Patients reports recent readings anywhere from 138-233 throughout the day.   Anxiety   Subjective    Diabetes  Anxiety     HPI     Diabetes    Additional comments: Patients reports recent readings anywhere from 138-233 throughout the day.      Last edited by Acey Lav, CMA on 01/17/2024  3:12 PM.       Discussed the use of AI scribe software for clinical note transcription with the patient, who gave verbal consent to proceed.  History of Present Illness   The patient, with a history of diabetes, hypertension, and anxiety, presents with recent high blood sugar readings. He reports that his blood sugars were over 400 last week, but have since decreased to the 130s to 230s range. The patient has been taking insulin to manage his diabetes, but he has had difficulty obtaining his diabetes medications due to insurance issues. The patient also reports experiencing significant anxiety and stress due to his health issues and personal circumstances, including his father's upcoming open heart surgery.  In addition to his diabetes, the patient has a hernia that he has been avoiding treating for approximately two to three years. He also needs a colonoscopy. The patient is currently on a leave of absence from work and is planning to use this time to address these health issues. He is seeking advice on how to prioritize these treatments.      2/17 x 12wk LOA     01/17/2024    3:17 PM 01/07/2023   10:24 AM 09/30/2022    1:15 PM 09/13/2022    4:09 PM 08/13/2022    1:07 PM  Depression screen PHQ 2/9  Decreased Interest 2 1 1 1 2   Down, Depressed, Hopeless 2 1 1 1 2   PHQ - 2 Score 4 2 2 2 4   Altered sleeping 3 1 2 2 3    Tired, decreased energy 3 1 3 2 3   Change in appetite 1 2 3 1 2   Feeling bad or failure about yourself  2 1 1 1 2   Trouble concentrating 1 1 2 2 3   Moving slowly or fidgety/restless 1 0 0 0 1  Suicidal thoughts 1 0 0 0 0  PHQ-9 Score 16 8 13 10 18   Difficult doing work/chores Extremely dIfficult Somewhat difficult Very difficult Somewhat difficult Very difficult       01/17/2024    3:18 PM 09/13/2022    4:11 PM 08/13/2022    1:05 PM 03/06/2020   10:12 AM  GAD 7 : Generalized Anxiety Score  Nervous, Anxious, on Edge 2 1 2 2   Control/stop worrying 1 1 3 2   Worry too much - different things 2 1 2 2   Trouble relaxing 2 1 2 1   Restless 0 0 1 2  Easily annoyed or irritable 2 1 2 2   Afraid - awful might happen 0 1 2 1   Total GAD 7 Score 9 6 14 12   Anxiety Difficulty Very difficult Somewhat difficult Somewhat difficult Somewhat difficult     Medications: Outpatient Medications Prior to Visit  Medication Sig   Blood Glucose Monitoring Suppl (ONE TOUCH ULTRA 2) w/Device KIT Use as directed  clonazePAM (KLONOPIN) 0.5 MG tablet Take 1 tablet (0.5 mg total) by mouth 2 (two) times daily as needed for anxiety.   insulin degludec (TRESIBA FLEXTOUCH) 100 UNIT/ML FlexTouch Pen Inject 10 Units into the skin daily.   levothyroxine (SYNTHROID) 112 MCG tablet TAKE 2 TABLETS (224 MCG TOTAL) BY MOUTH DAILY. EXCEPT ON MONDAYS, TAKE 3 TABLETS   losartan (COZAAR) 100 MG tablet Take 1 tablet (100 mg total) by mouth daily.   Multiple Vitamin (MULTI-VITAMINS) TABS Take by mouth.   OneTouch Delica Lancets 33G MISC Use as directed   ONETOUCH ULTRA TEST test strip USE AS INSTRUCTED TO CHECK BLOOD GLUCOSE   rosuvastatin (CRESTOR) 5 MG tablet Take 1 tablet (5 mg total) by mouth daily.   sertraline (ZOLOFT) 100 MG tablet Take 2 tablets (200 mg total) by mouth daily.   [DISCONTINUED] tirzepatide [facility]) 5 MG/0.5ML Pen Inject 5 mg into the skin once a week. (Patient not taking: Reported on 01/17/2024)   No  facility-administered medications prior to visit.    Review of Systems     Objective    BP (!) 159/115 (BP Location: Left Arm, Patient Position: Sitting, Cuff Size: Large)   Pulse 68   Ht 6' (1.829 m)   Wt 255 lb 12.8 oz (116 kg)   SpO2 100%   BMI 34.69 kg/m    Physical Exam Vitals reviewed.  Constitutional:      General: He is not in acute distress.    Appearance: Normal appearance. He is not diaphoretic.  HENT:     Head: Normocephalic and atraumatic.  Eyes:     General: No scleral icterus.    Conjunctiva/sclera: Conjunctivae normal.  Cardiovascular:     Rate and Rhythm: Normal rate and regular rhythm.     Heart sounds: Normal heart sounds. No murmur heard. Pulmonary:     Effort: Pulmonary effort is normal. No respiratory distress.     Breath sounds: Normal breath sounds. No wheezing or rhonchi.  Abdominal:     Hernia: A hernia is present.  Musculoskeletal:     Cervical back: Neck supple.     Right lower leg: No edema.     Left lower leg: No edema.  Lymphadenopathy:     Cervical: No cervical adenopathy.  Skin:    General: Skin is warm and dry.     Findings: No rash.  Neurological:     Mental Status: He is alert and oriented to person, place, and time. Mental status is at baseline.  Psychiatric:        Mood and Affect: Mood normal.        Behavior: Behavior normal.      No results found for any visits on 01/17/24.  Assessment & Plan     Problem List Items Addressed This Visit       Cardiovascular and Mediastinum   Hypertension associated with diabetes (HCC)   Hypertension noted with elevated blood pressure readings. Jardiance may help lower blood pressure slightly. - Monitor blood pressure regularly - Continue current antihypertensive medications      Relevant Medications   empagliflozin (JARDIANCE) 25 MG TABS tablet     Endocrine   Postablative hypothyroidism   Currently managed with Synthroid 224 mcg daily, except 336 mcg on Mondays. Reports  increased sleepiness and napping, which may be related to thyroid function or other stressors. - Order thyroid function tests - Continue Synthroid 224 mcg daily, except 336 mcg on Mondays      Type 2 diabetes mellitus (HCC) -  Primary   Type 2 Diabetes Mellitus with recent blood glucose readings improving from over 400 mg/dL to 161-096 mg/dL. Currently on Tresiba (insulin) 10 units daily. Metformin was discontinued due to GI side effects. GLP-1 agonists are not affordable due to high insurance copays. Jardiance (empagliflozin) was discussed as an alternative due to its benefits in lowering A1c, kidney protection, and cardiovascular protection. Risks include potential urinary tract infections and yeast infections. Emphasized the importance of obtaining a Jardiance copay card to reduce costs. - Continue Tresiba 10 units daily - Start Jardiance 25 mg daily - Obtain Jardiance copay card before pharmacy visit - Monitor blood glucose levels fasting and 2 hours postprandial - Follow up in 4-6 weeks to reassess blood glucose control - Order urine test for kidney disease marker - Order lab work for diabetes management      Relevant Medications   empagliflozin (JARDIANCE) 25 MG TABS tablet   Other Relevant Orders   Pneumococcal conjugate vaccine 20-valent (Completed)   Hyperlipidemia associated with type 2 diabetes mellitus (HCC)   Currently managed with Crestor 5 mg daily. - Continue Crestor 5 mg daily - Order lipid panel      Relevant Medications   empagliflozin (JARDIANCE) 25 MG TABS tablet   Microalbuminuria due to type 2 diabetes mellitus (HCC)   Continue losartan      Relevant Medications   empagliflozin (JARDIANCE) 25 MG TABS tablet     Other   Umbilical hernia without obstruction and without gangrene   Umbilical hernia present for 2.5-3 years, causing discomfort. Surgery has been delayed due to other health concerns. Discussed potential delay in surgery due to A1c levels. - Resend  referral to surgeon for hernia repair - Schedule hernia surgery after colonoscopy      Relevant Orders   Ambulatory referral to General Surgery   GAD (generalized anxiety disorder)   Reports significant anxiety and stress related to multiple health issues and family concerns, including his father's upcoming open-heart surgery. Discussed the importance of managing stress and anxiety. - Consider referral to mental health services if anxiety persists      Other Visit Diagnoses       Tobacco use       Relevant Orders   Pneumococcal conjugate vaccine 20-valent (Completed)     Colon cancer screening       Relevant Orders   Ambulatory referral to Gastroenterology          Colonoscopy Screening Colonoscopy needed for routine screening. Recommended scheduling colonoscopy before hernia surgery to avoid delays due to recovery time. - Resend referral for colonoscopy - Schedule colonoscopy before hernia surgery  General Health Maintenance Routine health maintenance discussed, including vaccinations and screenings. - Administer pneumonia shot  Follow-up - Follow up in 4-6 weeks for diabetes management - Ensure prior authorization for Jardiance - Coordinate with GI and surgical teams for colonoscopy and hernia surgery scheduling - Complete and submit short-term disability paperwork by March 10.        Return in about 4 weeks (around 02/14/2024) for chronic disease f/u.       Shirlee Latch, MD  [facility] [phone] (phone) [phone] (fax)  [facility]

## 2024-01-18 LAB — MICROALBUMIN / CREATININE URINE RATIO
Creatinine, Urine: 70 mg/dL
Microalb/Creat Ratio: 72 mg/g{creat} — ABNORMAL HIGH (ref 0–29)
Microalbumin, Urine: 50.1 ug/mL

## 2024-01-18 LAB — COMPREHENSIVE METABOLIC PANEL
ALT: 79 [IU]/L — ABNORMAL HIGH (ref 0–44)
AST: 49 [IU]/L — ABNORMAL HIGH (ref 0–40)
Albumin: 4.6 g/dL (ref 4.1–5.1)
Alkaline Phosphatase: 67 [IU]/L (ref 44–121)
BUN/Creatinine Ratio: 12 (ref 9–20)
BUN: 11 mg/dL (ref 6–24)
Bilirubin Total: 0.5 mg/dL (ref 0.0–1.2)
CO2: 24 mmol/L (ref 20–29)
Calcium: 9.1 mg/dL (ref 8.7–10.2)
Chloride: 101 mmol/L (ref 96–106)
Creatinine, Ser: 0.95 mg/dL (ref 0.76–1.27)
Globulin, Total: 2.1 g/dL (ref 1.5–4.5)
Glucose: 201 mg/dL — ABNORMAL HIGH (ref 70–99)
Potassium: 4 mmol/L (ref 3.5–5.2)
Sodium: 139 mmol/L (ref 134–144)
Total Protein: 6.7 g/dL (ref 6.0–8.5)
eGFR: 100 mL/min/{1.73_m2} (ref >59)

## 2024-01-18 LAB — LIPID PANEL
Chol/HDL Ratio: 3.5 {ratio} (ref 0.0–5.0)
Cholesterol, Total: 129 mg/dL (ref 100–199)
HDL: 37 mg/dL — ABNORMAL LOW (ref >39)
LDL Chol Calc (NIH): 73 mg/dL (ref 0–99)
Triglycerides: 102 mg/dL (ref 0–149)
VLDL Cholesterol Cal: 19 mg/dL (ref 5–40)

## 2024-01-18 LAB — HEMOGLOBIN A1C
Est. average glucose Bld gHb Est-mCnc: 246 mg/dL
Hgb A1c MFr Bld: 10.2 % — ABNORMAL HIGH (ref 4.8–5.6)

## 2024-01-18 LAB — TSH: TSH: 2.28 u[IU]/mL (ref 0.450–4.500)

## 2024-01-18 NOTE — Telephone Encounter (Signed)
 Requested Prescriptions  Pending Prescriptions Disp Refills   sertraline (ZOLOFT) 100 MG tablet [Pharmacy Med Name: SERTRALINE HCL 100 MG TABLET] 180 tablet 0    Sig: TAKE 2 TABLETS BY MOUTH EVERY DAY     Psychiatry:  Antidepressants - SSRI - sertraline Failed - 01/18/2024  1:35 PM      Failed - AST in normal range and within 360 days    AST  Date Value Ref Range Status  01/17/2024 49 (H) 0 - 40 IU/L Final         Failed - ALT in normal range and within 360 days    ALT  Date Value Ref Range Status  01/17/2024 79 (H) 0 - 44 IU/L Final         Passed - Completed PHQ-2 or PHQ-9 in the last 360 days      Passed - Valid encounter within last 6 months    Recent Outpatient Visits           1 month ago Type 2 diabetes mellitus with diabetic microalbuminuria, without long-term current use of insulin (HCC)   [place] [facility] [place], [name], MD   1 year ago Hypertension associated with diabetes [facility])   [place] [facility] [place], [name], MD   1 year ago Type 2 diabetes mellitus without complication, without long-term current use of insulin [facility]   [place] [facility] [place], [name], MD   1 year ago Type 2 diabetes mellitus without complication, without long-term current use of insulin [facility])   [place] [facility] [place], [name], MD   1 year ago GAD (generalized anxiety disorder)   [place] [facility] Simmons-Robinson, Tawanna Cooler, MD       Future Appointments             In 1 month Bacigalupo, [name], MD [facility], [name]

## 2024-01-19 ENCOUNTER — Encounter: Payer: Self-pay | Admitting: Family Medicine

## 2024-01-19 NOTE — Telephone Encounter (Unsigned)
 Copied from CRM [id number]. Topic: Medical Record Request - Other >> Jan 19, 2024  8:51 AM Gildardo Pounds wrote: Reason for CRM: Selena Batten with [employer] is inquiring about appointments for diabetes for patient with claim number [id number]. Callback number is [phone]

## 2024-01-20 NOTE — Telephone Encounter (Signed)
 Forms faxed and pt notified.  Original left at the front desk for pt and copy sent to scan center.

## 2024-01-20 NOTE — Telephone Encounter (Signed)
Forms completed and given to front desk.

## 2024-01-23 ENCOUNTER — Telehealth: Payer: Self-pay

## 2024-01-23 NOTE — Telephone Encounter (Signed)
 Pt requesting call back to schedule colonoscopy.

## 2024-01-24 NOTE — Telephone Encounter (Signed)
 Patient called back. He will need to check with his wife before scheduling. Patient was given a few dates.

## 2024-01-24 NOTE — Telephone Encounter (Signed)
 Message left for patient to return my call.

## 2024-01-24 NOTE — Telephone Encounter (Signed)
 Copied from CRM [id number]. Topic: General - Other >> Jan 23, 2024  4:14 PM Edward Hurley wrote: Reason for CRM: The patient is calling to get an update on the status of the prior authorization for their prescription of empagliflozin (JARDIANCE) 25 MG TABS tablet [[id number]]  Please contact when possible

## 2024-01-25 ENCOUNTER — Other Ambulatory Visit (HOSPITAL_COMMUNITY): Payer: Self-pay

## 2024-01-25 ENCOUNTER — Telehealth: Payer: Self-pay | Admitting: Family Medicine

## 2024-01-25 ENCOUNTER — Telehealth: Payer: Self-pay

## 2024-01-25 NOTE — Telephone Encounter (Signed)
 Pt wife dropped off FMLA forms to be completed. They are due on 01/26/24.  Please call patient when forms are ready [phone]

## 2024-01-25 NOTE — Telephone Encounter (Signed)
 Patient's wife told that Dr. B was out of the office today and the forms may not be completed by 01/26/24.

## 2024-01-25 NOTE — Telephone Encounter (Signed)
 Pharmacy Patient Advocate Encounter   Received notification from Pt Calls Messages that prior authorization for Jardiance 25MG  tablets is required/requested.   Insurance verification completed.   The patient is insured through  [place]  .   Per test claim: PA required; PA submitted to above mentioned insurance via CoverMyMeds Key/confirmation #/EOC [id number] Status is pending

## 2024-01-27 DIAGNOSIS — Z0279 Encounter for issue of other medical certificate: Secondary | ICD-10-CM

## 2024-01-27 NOTE — Telephone Encounter (Signed)
 Completed. Will leave at front desk

## 2024-01-27 NOTE — Telephone Encounter (Signed)
 Called patient to inform FMLA paperwork is complete and waiting to be pick up had to leave message on voicemail

## 2024-01-30 ENCOUNTER — Other Ambulatory Visit (HOSPITAL_COMMUNITY): Payer: Self-pay

## 2024-01-30 ENCOUNTER — Encounter: Payer: Self-pay | Admitting: *Deleted

## 2024-01-30 ENCOUNTER — Other Ambulatory Visit: Payer: Self-pay | Admitting: *Deleted

## 2024-01-30 ENCOUNTER — Telehealth: Payer: Self-pay | Admitting: *Deleted

## 2024-01-30 DIAGNOSIS — Z1211 Encounter for screening for malignant neoplasm of colon: Secondary | ICD-10-CM

## 2024-01-30 MED ORDER — NA SULFATE-K SULFATE-MG SULF 17.5-3.13-1.6 GM/177ML PO SOLN: 1.0000 | Freq: Once | ORAL | 0 refills | Status: AC

## 2024-01-30 NOTE — Telephone Encounter (Signed)
 Pharmacy Patient Advocate Encounter  Received notification from [facility] that Prior Authorization for Jardiance 25MG  tablets  has been APPROVED from 01/28/24 to 01/27/25. Ran test claim, Copay is $1709.67. This test claim was processed through [facility]- copay amounts may vary at other pharmacies due to pharmacy/plan contracts, or as the patient moves through the different stages of their insurance plan.   PA #/Case ID/Reference #: [id number]

## 2024-01-30 NOTE — Telephone Encounter (Signed)
 Gastroenterology Pre-Procedure Review  Request Date: 02/14/2024 Requesting Physician: Dr. Allegra Lai  PATIENT REVIEW QUESTIONS: The patient responded to the following health history questions as indicated:    1. Are you having any GI issues? no 2. Do you have a personal history of Polyps? no 3. Do you have a family history of Colon Cancer or Polyps? no 4. Diabetes Mellitus? yes (taking insulin and Jardiance) 5. Joint replacements in the past 12 months?no 6. Major health problems in the past 3 months?no 7. Any artificial heart valves, MVP, or defibrillator?no    MEDICATIONS & ALLERGIES:    Patient reports the following regarding taking any anticoagulation/antiplatelet therapy:   Plavix, Coumadin, Eliquis, Xarelto, Lovenox, Pradaxa, Brilinta, or Effient? no Aspirin? yes (81 mg)  Patient confirms/reports the following medications:  Current Outpatient Medications  Medication Sig Dispense Refill   Blood Glucose Monitoring Suppl (ONE TOUCH ULTRA 2) w/Device KIT Use as directed 1 kit 0   clonazePAM (KLONOPIN) 0.5 MG tablet Take 1 tablet (0.5 mg total) by mouth 2 (two) times daily as needed for anxiety. 15 tablet 2   empagliflozin (JARDIANCE) 25 MG TABS tablet Take 1 tablet (25 mg total) by mouth daily before breakfast. 90 tablet 1   insulin degludec (TRESIBA FLEXTOUCH) 100 UNIT/ML FlexTouch Pen Inject 10 Units into the skin daily. 15 mL 1   levothyroxine (SYNTHROID) 112 MCG tablet TAKE 2 TABLETS (224 MCG TOTAL) BY MOUTH DAILY. EXCEPT ON MONDAYS, TAKE 3 TABLETS 64 tablet 6   losartan (COZAAR) 100 MG tablet Take 1 tablet (100 mg total) by mouth daily. 90 tablet 3   Multiple Vitamin (MULTI-VITAMINS) TABS Take by mouth.     OneTouch Delica Lancets 33G MISC Use as directed 100 each 3   ONETOUCH ULTRA TEST test strip USE AS INSTRUCTED TO CHECK BLOOD GLUCOSE 100 strip 12   rosuvastatin (CRESTOR) 5 MG tablet Take 1 tablet (5 mg total) by mouth daily. 90 tablet 3   sertraline (ZOLOFT) 100 MG tablet TAKE 2  TABLETS BY MOUTH EVERY DAY 180 tablet 0   No current facility-administered medications for this visit.    Patient confirms/reports the following allergies:  No Known Allergies  No orders of the defined types were placed in this encounter.   AUTHORIZATION INFORMATION Primary Insurance: 1D#: Group #:  Secondary Insurance: 1D#: Group #:  SCHEDULE INFORMATION: Date: 04/04/2024 Time: Location:  [facility]

## 2024-01-31 NOTE — Telephone Encounter (Signed)
 Called pt and left vm letting him know that the message from when this was dropped off was to let pt know when ready for p/u.  It also states on the forms to not return to department of labor.  Return to the patient.

## 2024-01-31 NOTE — Telephone Encounter (Signed)
 Copied from CRM [id number]. Topic: Medical Record Request - Other >> Jan 30, 2024  3:19 PM Ivette P wrote: Reason for CRM: Pt called in about FMLA paperwork and where it was sent to. Pt states employer has not received, pt would like a callback [phone]

## 2024-02-07 ENCOUNTER — Encounter: Payer: Self-pay | Admitting: Gastroenterology

## 2024-02-08 ENCOUNTER — Other Ambulatory Visit: Payer: Self-pay | Admitting: Family Medicine

## 2024-02-08 NOTE — Anesthesia Preprocedure Evaluation (Addendum)
 Anesthesia Evaluation  Patient identified by MRN, date of birth, ID band Patient awake    Reviewed: Allergy & Precautions, NPO status , Patient's Chart, lab work & pertinent test results  History of Anesthesia Complications Negative for: history of anesthetic complications  Airway Mallampati: I   Neck ROM: Full    Dental no notable dental hx.    Pulmonary sleep apnea and Continuous Positive Airway Pressure Ventilation , Current Smoker (occasional) and Patient abstained from smoking.   Pulmonary exam normal breath sounds clear to auscultation       Cardiovascular hypertension, Normal cardiovascular exam Rhythm:Regular Rate:Normal     Neuro/Psych  Headaches PSYCHIATRIC DISORDERS Anxiety        GI/Hepatic negative GI ROS,,,  Endo/Other  diabetes, Type 2Hypothyroidism  Obesity   Renal/GU      Musculoskeletal   Abdominal   Peds  Hematology negative hematology ROS (+)   Anesthesia Other Findings Hypertension  Irritability Allergy Anxiety Hyperthyroidism  Heart murmur Headache  Graves disease Diabetes (HCC)  Umbilical hernia Sleep apnea     Reproductive/Obstetrics                             Anesthesia Physical Anesthesia Plan  ASA: 2  Anesthesia Plan: General   Post-op Pain Management:    Induction: Intravenous  PONV Risk Score and Plan: 1 and Propofol infusion, TIVA and Treatment may vary due to age or medical condition  Airway Management Planned: Natural Airway and Nasal Cannula  Additional Equipment:   Intra-op Plan:   Post-operative Plan:   Informed Consent: I have reviewed the patients History and Physical, chart, labs and discussed the procedure including the risks, benefits and alternatives for the proposed anesthesia with the patient or authorized representative who has indicated his/her understanding and acceptance.     Dental Advisory Given  Plan Discussed  with: Anesthesiologist, CRNA and Surgeon  Anesthesia Plan Comments: (Patient consented for risks of anesthesia including but not limited to:  - adverse reactions to medications - risk of airway placement if required - damage to eyes, teeth, lips or other oral mucosa - nerve damage due to positioning  - sore throat or hoarseness - Damage to heart, brain, nerves, lungs, other parts of body or loss of life  Patient voiced understanding and assent.)        Anesthesia Quick Evaluation

## 2024-02-09 ENCOUNTER — Telehealth: Payer: Self-pay | Admitting: Pharmacy Technician

## 2024-02-09 ENCOUNTER — Other Ambulatory Visit (HOSPITAL_COMMUNITY): Payer: Self-pay

## 2024-02-09 DIAGNOSIS — E1169 Type 2 diabetes mellitus with other specified complication: Secondary | ICD-10-CM

## 2024-02-09 NOTE — Telephone Encounter (Signed)
 Pharmacy Patient Advocate Encounter   Received notification from CoverMyMeds that prior authorization for Rosuvastatin Calcium 5MG  tablets is required/requested.   Insurance verification completed.   The patient is insured through [facility] .   Per test claim:  ATORVASTATIN is preferred by the insurance.  If suggested medication is appropriate, Please send in a new RX and discontinue this one. If not, please advise as to why it's not appropriate so that we may request a Prior Authorization. Please note, some preferred medications may still require a PA.  If the suggested medications have not been trialed and there are no contraindications to their use, the PA will not be submitted, as it will not be approved.

## 2024-02-09 NOTE — Telephone Encounter (Signed)
 Requested Prescriptions  Pending Prescriptions Disp Refills   rosuvastatin (CRESTOR) 5 MG tablet [Pharmacy Med Name: ROSUVASTATIN CALCIUM 5 MG TAB] 90 tablet 3    Sig: TAKE 1 TABLET (5 MG TOTAL) BY MOUTH DAILY.     Cardiovascular:  Antilipid - Statins 2 Failed - 02/09/2024  9:52 AM      Failed - Lipid Panel in normal range within the last 12 months    Cholesterol, Total  Date Value Ref Range Status  01/17/2024 129 100 - 199 mg/dL Final   LDL Chol Calc (NIH)  Date Value Ref Range Status  01/17/2024 73 0 - 99 mg/dL Final   HDL  Date Value Ref Range Status  01/17/2024 37 (L) >39 mg/dL Final   Triglycerides  Date Value Ref Range Status  01/17/2024 102 0 - 149 mg/dL Final         Passed - Cr in normal range and within 360 days    Creat  Date Value Ref Range Status  05/06/2017 0.89 0.60 - 1.35 mg/dL Final   Creatinine, Ser  Date Value Ref Range Status  01/17/2024 0.95 0.76 - 1.27 mg/dL Final         Passed - Patient is not pregnant      Passed - Valid encounter within last 12 months    Recent Outpatient Visits           2 months ago Type 2 diabetes mellitus with diabetic microalbuminuria, without long-term current use of insulin (HCC)   [place] [facility] [place], [name], MD   1 year ago Hypertension associated with diabetes [facility])   [place] [facility] [place], [name], MD   1 year ago Type 2 diabetes mellitus without complication, without long-term current use of insulin (HCC)   [place] [facility] [place], [name], MD   1 year ago Type 2 diabetes mellitus without complication, without long-term current use of insulin [facility])   [place] [facility] [place], [name], MD   1 year ago GAD (generalized anxiety disorder)   [place] [facility] Simmons-Robinson, [place], MD       Future Appointments             In 2 weeks Bacigalupo, [name], MD   [name]             losartan (COZAAR) 100 MG tablet [Pharmacy Med Name: LOSARTAN POTASSIUM 100 MG TAB] 90 tablet 0    Sig: TAKE 1 TABLET BY MOUTH EVERY DAY     Cardiovascular:  Angiotensin Receptor Blockers Failed - 02/09/2024  9:52 AM      Failed - Last BP in normal range    BP Readings from Last 1 Encounters:  01/17/24 (!) 159/115         Passed - Cr in normal range and within 180 days    Creat  Date Value Ref Range Status  05/06/2017 0.89 0.60 - 1.35 mg/dL Final   Creatinine, Ser  Date Value Ref Range Status  01/17/2024 0.95 0.76 - 1.27 mg/dL Final         Passed - K in normal range and within 180 days    Potassium  Date Value Ref Range Status  01/17/2024 4.0 3.5 - 5.2 mmol/L Final         Passed - Patient is not pregnant      Passed - Valid encounter within last 6 months    Recent  Outpatient Visits           2 months ago Type 2 diabetes mellitus with diabetic microalbuminuria, without long-term current use of insulin [facility])   [place] [facility] [place], [name], MD   1 year ago Hypertension associated with diabetes [facility])   [place] [facility] [place], [name], MD   1 year ago Type 2 diabetes mellitus without complication, without long-term current use of insulin [facility])   [place] [facility] [place], [name], MD   1 year ago Type 2 diabetes mellitus without complication, without long-term current use of insulin [facility])   [place] [facility] [place], [name], MD   1 year ago GAD (generalized anxiety disorder)   [place] [facility] Simmons-Rob[facility], Tawanna Cooler, MD       Future Appointments             In 2 weeks Bacigalupo, [name], MD [facility], [name]

## 2024-02-10 ENCOUNTER — Encounter: Payer: Self-pay | Admitting: Surgery

## 2024-02-10 ENCOUNTER — Ambulatory Visit: Payer: Self-pay | Admitting: Surgery

## 2024-02-10 VITALS — BP 161/119 | HR 73 | Temp 98.3°F | Ht 72.0 in | Wt 252.0 lb

## 2024-02-10 DIAGNOSIS — K429 Umbilical hernia without obstruction or gangrene: Secondary | ICD-10-CM | POA: Diagnosis not present

## 2024-02-10 DIAGNOSIS — K4020 Bilateral inguinal hernia, without obstruction or gangrene, not specified as recurrent: Secondary | ICD-10-CM | POA: Diagnosis not present

## 2024-02-10 NOTE — Progress Notes (Signed)
 02/10/2024  Reason for Visit:  Umbilical hernia  Requesting Provider:  Shirlee Latch, MD  History of Present Illness: Edward Hurley is a 48 y.o. male presenting for evaluation of an umbilical hernia.  The patient reports that he has had this for about 2 or 3 years.  He has not specifically noted any worsening pain or symptoms but does report some occasional discomfort particularly when straining or depending on positioning or stretching.  Denies any episodes where the hernia bulges significantly or gets significantly tender.  Denies any pain or bulging sensation in either groin.  He had seen Dr. Duanne Guess in 2022 for umbilical hernia repair.  However surgery was never scheduled as the patient reported a new job and could not take time off right away.  He never followed up to schedule again.  He recently saw Dr. Beryle Flock and on follow-up labs, his hemoglobin A1c was significantly elevated compared to a year ago and was 10.2.  He reports not being as compliant with his diabetes treatment but has now started using his insulin and reports that his blood glucose levels have improved.  His next follow-up with Dr. Beryle Flock is on 02/28/24.  Past Medical History: Past Medical History:  Diagnosis Date   Allergy    Anxiety    Diabetes (HCC)    Graves disease 05/07/2017   Headache    Heart murmur    childhood   Hypertension    Hyperthyroidism    Irritability    Sleep apnea    CPAP   Umbilical hernia      Past Surgical History: Past Surgical History:  Procedure Laterality Date   NO PAST SURGERIES      Home Medications: Prior to Admission medications   Medication Sig Start Date End Date Taking? Authorizing Provider  Blood Glucose Monitoring Suppl (ONE TOUCH ULTRA 2) w/Device KIT Use as directed 12/02/23   Erasmo Downer, MD  clonazePAM (KLONOPIN) 0.5 MG tablet Take 1 tablet (0.5 mg total) by mouth 2 (two) times daily as needed for anxiety. 12/02/23   Erasmo Downer, MD  empagliflozin (JARDIANCE) 25 MG TABS tablet Take 1 tablet (25 mg total) by mouth daily before breakfast. Patient not taking: Reported on 02/07/2024 01/17/24   Erasmo Downer, MD  insulin degludec (TRESIBA FLEXTOUCH) 100 UNIT/ML FlexTouch Pen Inject 10 Units into the skin daily. 01/09/24   Erasmo Downer, MD  levothyroxine (SYNTHROID) 112 MCG tablet TAKE 2 TABLETS (224 MCG TOTAL) BY MOUTH DAILY. EXCEPT ON MONDAYS, TAKE 3 TABLETS 08/03/23   Erasmo Downer, MD  losartan (COZAAR) 100 MG tablet TAKE 1 TABLET BY MOUTH EVERY DAY 02/09/24   Bacigalupo, [name], MD  Multiple Vitamin (MULTI-VITAMINS) TABS Take by mouth.    [provider]  OneTouch Delica Lancets 33G MISC Use as directed 08/01/23   Erasmo Downer, MD  [facility] ULTRA TEST test strip USE AS INSTRUCTED TO CHECK BLOOD GLUCOSE 08/03/23   Erasmo Downer, MD  rosuvastatin (CRESTOR) 5 MG tablet TAKE 1 TABLET (5 MG TOTAL) BY MOUTH DAILY. 02/09/24   Erasmo Downer, MD  sertraline (ZOLOFT) 100 MG tablet TAKE 2 TABLETS BY MOUTH EVERY DAY 01/18/24   Beryle Flock [name], MD    Allergies: No Known Allergies  Social History:  reports that he has been smoking cigarettes. He has never used smokeless tobacco. He reports current alcohol use. He reports that he does not use drugs.   Family History: Family History  Problem Relation Age  of Onset   Anxiety disorder Mother    Hyperlipidemia Father    Hypertension Father    Alcoholism Maternal Uncle    Mental illness Maternal Uncle    Prostate cancer Paternal Uncle    Thyroid disease Neg Hx    Breast cancer Neg Hx    Colon cancer Neg Hx     Review of Systems: Review of Systems  Constitutional:  Negative for chills and fever.  Respiratory:  Negative for shortness of breath.   Cardiovascular:  Negative for chest pain.  Gastrointestinal:  Positive for abdominal pain (minimal discomfort at umbilicus). Negative for nausea and vomiting.  Genitourinary:   Negative for dysuria.    Physical Exam BP (!) 161/119   Pulse 73   Temp 98.3 F (36.8 C)   Ht 6' (1.829 m)   Wt 252 lb (114.3 kg)   SpO2 98%   BMI 34.18 kg/m  CONSTITUTIONAL: No acute distress HEENT:  Normocephalic, atraumatic, extraocular motion intact. NECK: Trachea is midline, and there is no jugular venous distension.  RESPIRATORY:  Lungs are clear, and breath sounds are equal bilaterally. Normal respiratory effort without pathologic use of accessory muscles. CARDIOVASCULAR: Heart is regular without murmurs, gallops, or rubs. GI: The abdomen is soft, nondistended, nontender to palpation.  Patient has a reducible umbilical hernia although with some effort to reduce it.  Hernia defect is about 2.5 cm.  There is some overlying skin thinness but no evidence of ulceration or erythema.  On exam, there may be a right inguinal hernia but is very subtle if at all present and may be difficult to palpate well due to body habitus.  No palpable hernias in the left groin.  MUSCULOSKELETAL:  Normal muscle strength and tone in all four extremities.  No peripheral edema or cyanosis. SKIN: Skin turgor is normal. There are no pathologic skin lesions.  NEUROLOGIC:  Motor and sensation is grossly normal.  Cranial nerves are grossly intact. PSYCH:  Alert and oriented to person, place and time. Affect is normal.  Laboratory Analysis: Labs from 01/17/2024: Sodium 139, potassium 4, chloride 101, CO2 24, BUN 11, creatinine 0.95.  Total bilirubin 0.5, AST 49, ALT 79, alkaline phosphatase 67.  Hemoglobin A1c 10.5.  Imaging: CT abdomen/pelvis on 03/13/2020: IMPRESSION: 1. Moderate size fat containing umbilical hernia with minimal stranding within the fat. No fluid. 2. Moderate fat containing bilateral inguinal hernias. 3. Signs of hepatic steatosis with lobular hepatic contours. Correlate with any clinical or laboratory evidence of liver disease.  Assessment and Plan: This is a 48 y.o. male with a reducible  umbilical hernia.  - Discussed with patient findings on exam and that he does have a umbilical hernia that appears to be stable in size relative to the CT scan from 2021.  Although CT does mention bilateral inguinal hernias, I am not able to palpate a hernia in the left groin there may only be a subtle change in the right groin.  At this point he is fully asymptomatic from the inguinal standpoint and only has some discomfort at the umbilicus particularly based on motion or positioning.  His symptoms are not too significant however and only reports some mild discomfort.  However he is interested in surgical repair and he is trying to get his health back on track. - Discussed with the patient that with his significant increase in his hemoglobin A1c to 10.2, at this point we would not be able to offer him surgery given the increased risk of complications such as  infection, hernia recurrence, wound healing issues.  The patient reports that he has been more compliant with his treatment and reports daily glucose levels are much improved compared to before.  He has an appointment with Dr. Beryle Flock on 02/28/24.  Discussed with the patient that if his hemoglobin A1c has improved by then, then we should be able to schedule him for surgery. - Patient will follow-up with me in about a month to reassess and potentially schedule him for surgery.  Discussed with him briefly that surgical plan will most likely be a robotic assisted umbilical hernia repair. - All of his questions have been answered.  I spent 30 minutes dedicated to the care of this patient on the date of this encounter to include pre-visit review of records, face-to-face time with the patient discussing diagnosis and management, and any post-visit coordination of care.   Howie Ill, MD [facility]

## 2024-02-10 NOTE — Patient Instructions (Signed)
 Umbilical Hernia, Adult  A hernia is a lump of tissue that pushes through an opening in the muscles. An umbilical hernia happens in the belly, near the belly button. The hernia may contain tissues from the small or large intestine. It may also have fatty tissue that covers the intestines. Umbilical hernias in adults may get worse over time. They need to be treated with surgery. There are several types of umbilical hernias. They include: Indirect hernia. This occurs just above or below the belly button. It's the most common type of umbilical hernia in adults. Direct hernia. This type occurs in an opening that's formed by the belly button. Reducible hernia. This hernia comes and goes. You may see it only when you strain, cough, or lift something heavy. This type of hernia can be pushed back into the belly (reduced). Incarcerated hernia. This traps the hernia in the wall of the belly. This type of hernia can't be pushed back into the belly. It can cause a strangulated hernia. Strangulated hernia. This hernia cuts off blood flow to the tissues inside the hernia. The tissues can die if this happens. This type of hernia must be treated right away. What are the causes? An umbilical hernia happens when tissue inside the belly pushes through an opening in the muscles of the belly. What increases the risk? You're more likely to get this hernia if: You strain while lifting or pushing heavy objects. You've had several pregnancies. You have a condition that puts pressure on your belly, and you've had it for a long time. These include: Obesity. A buildup of fluid inside your belly. Vomiting or coughing all the time. Trouble pooping (constipation). You've had surgery that weakened the muscles in the belly. What are the signs or symptoms? The main symptom of this condition is a bulge at the belly button or near it. The bulge does not cause pain. Other symptoms depend on the type of hernia you have. A  reducible hernia may be seen only when you strain, cough, or lift something heavy. Other symptoms may include: Dull pain. A feeling of pressure. An incarcerated hernia may cause very bad pain. Also, you may: Vomit or feel like you may vomit. Not be able to pass gas. A strangulated hernia may cause: Pain that gets worse and worse. Vomiting, or feeling like you may vomit. Pain when you press on the hernia. Change of color on the skin over the hernia. The skin may become red or purple. Trouble pooping. Blood in the poop. How is this diagnosed? This condition may be diagnosed based on: Your symptoms and medical history. A physical exam. You may be asked to cough or strain while standing. These actions will put pressure inside your belly. The pressure can force the hernia through the opening in your muscles. Your health care provider may try to push the hernia back into your belly (reduce). How is this treated? Surgery is the only treatment for an umbilical hernia. Surgery for a strangulated hernia must be done right away. If you have a small hernia that's not incarcerated, you may need to lose weight before the surgery is done. Follow these instructions at home: Managing constipation You may need to take these actions to prevent trouble pooping. This will help to prevent straining. Drink enough fluid to keep your pee (urine) pale yellow. Take over-the-counter or prescription medicines. Eat foods that are high in fiber, such as beans, whole grains, and fresh fruits and vegetables. Limit foods that are high in  fat and sugars, such as fried or sweet foods. General instructions Do not try to push the hernia back in. Lose weight, if told by your provider. Watch your hernia for any changes in color or size. Tell your provider if any changes occur. You may need to avoid activities that put pressure on your hernia. You may have to avoid lifting. Ask your provider how much you can safely  lift. Take over-the-counter and prescription medicines only as told by your provider. Contact a health care provider if: Your hernia gets larger or feels hard. Your hernia becomes painful. You get a fever or chills. Get help right away if: You get very bad pain near the area of the hernia, and the pain comes on suddenly. You have pain and you vomit or feel like you may vomit. The skin over your hernia changes color. These symptoms may be an emergency. Get help right away. Call 911. Do not wait to see if the symptoms go away. Do not drive yourself to the hospital. This information is not intended to replace advice given to you by your health care provider. Make sure you discuss any questions you have with your health care provider. Document Revised: 03/01/2023 Document Reviewed: 03/01/2023 Elsevier Patient Education  2024 [employer].

## 2024-02-14 ENCOUNTER — Encounter: Payer: Self-pay | Admitting: Gastroenterology

## 2024-02-14 ENCOUNTER — Ambulatory Visit: Payer: Self-pay | Admitting: Anesthesiology

## 2024-02-14 ENCOUNTER — Encounter
Admission: RE | Disposition: A | Discharge status: Home / Self Care | Payer: Self-pay | Source: Home / Self Care | Attending: Gastroenterology

## 2024-02-14 ENCOUNTER — Ambulatory Visit
Admission: RE | Admit: 2024-02-14 | Discharge: 2024-02-14 | Disposition: A | Discharge status: Home / Self Care | Attending: Gastroenterology | Admitting: Gastroenterology

## 2024-02-14 DIAGNOSIS — Z79899 Other long term (current) drug therapy: Secondary | ICD-10-CM | POA: Insufficient documentation

## 2024-02-14 DIAGNOSIS — I10 Essential (primary) hypertension: Secondary | ICD-10-CM | POA: Insufficient documentation

## 2024-02-14 DIAGNOSIS — E119 Type 2 diabetes mellitus without complications: Secondary | ICD-10-CM | POA: Insufficient documentation

## 2024-02-14 DIAGNOSIS — Z7984 Long term (current) use of oral hypoglycemic drugs: Secondary | ICD-10-CM | POA: Insufficient documentation

## 2024-02-14 DIAGNOSIS — E039 Hypothyroidism, unspecified: Secondary | ICD-10-CM | POA: Diagnosis not present

## 2024-02-14 DIAGNOSIS — Z794 Long term (current) use of insulin: Secondary | ICD-10-CM | POA: Diagnosis not present

## 2024-02-14 DIAGNOSIS — E669 Obesity, unspecified: Secondary | ICD-10-CM | POA: Insufficient documentation

## 2024-02-14 DIAGNOSIS — Z6833 Body mass index (BMI) 33.0-33.9, adult: Secondary | ICD-10-CM | POA: Insufficient documentation

## 2024-02-14 DIAGNOSIS — G4730 Sleep apnea, unspecified: Secondary | ICD-10-CM | POA: Diagnosis not present

## 2024-02-14 DIAGNOSIS — F17210 Nicotine dependence, cigarettes, uncomplicated: Secondary | ICD-10-CM | POA: Diagnosis not present

## 2024-02-14 DIAGNOSIS — Z1211 Encounter for screening for malignant neoplasm of colon: Secondary | ICD-10-CM | POA: Diagnosis not present

## 2024-02-14 DIAGNOSIS — Z79890 Hormone replacement therapy (postmenopausal): Secondary | ICD-10-CM | POA: Diagnosis not present

## 2024-02-14 DIAGNOSIS — R519 Headache, unspecified: Secondary | ICD-10-CM | POA: Insufficient documentation

## 2024-02-14 DIAGNOSIS — F419 Anxiety disorder, unspecified: Secondary | ICD-10-CM | POA: Insufficient documentation

## 2024-02-14 DIAGNOSIS — K5730 Diverticulosis of large intestine without perforation or abscess without bleeding: Secondary | ICD-10-CM | POA: Diagnosis not present

## 2024-02-14 HISTORY — DX: Sleep apnea, unspecified: G47.30

## 2024-02-14 LAB — GLUCOSE, CAPILLARY: Glucose-Capillary: 112 mg/dL — ABNORMAL HIGH (ref 70–99)

## 2024-02-14 SURGERY — COLONOSCOPY
Anesthesia: General

## 2024-02-14 MED ORDER — PROPOFOL 500 MG/50ML IV EMUL
INTRAVENOUS | Status: DC | PRN
  Administered 2024-02-14: 160 ug/kg/min via INTRAVENOUS

## 2024-02-14 MED ORDER — STERILE WATER FOR IRRIGATION IR SOLN
Status: DC | PRN
  Administered 2024-02-14: 60 mL

## 2024-02-14 MED ORDER — SODIUM CHLORIDE 0.9 % IV SOLN: INTRAVENOUS | Status: DC

## 2024-02-14 MED ORDER — PROPOFOL 10 MG/ML IV BOLUS
INTRAVENOUS | Status: DC | PRN
  Administered 2024-02-14: 80 mg via INTRAVENOUS
  Administered 2024-02-14 (×2): 20 mg via INTRAVENOUS

## 2024-02-14 NOTE — Transfer of Care (Signed)
 Immediate Anesthesia Transfer of Care Note  Patient: Edward Hurley  Procedure(s) Performed: COLONOSCOPY  Patient Location: PACU  Anesthesia Type:General  Level of Consciousness: drowsy  Airway & Oxygen Therapy: Patient Spontanous Breathing  Post-op Assessment: Report given to RN and Post -op Vital signs reviewed and stable  Post vital signs: Reviewed and stable  Last Vitals:  Vitals Value Taken Time  BP 144/93 02/14/24 0835  Temp 35.7 C 02/14/24 0833  Pulse 78 02/14/24 0836  Resp 13 02/14/24 0836  SpO2 98 % 02/14/24 0836  Vitals shown include unfiled device data.  Last Pain:  Vitals:   02/14/24 0833  TempSrc: Temporal  PainSc: Asleep         Complications: No notable events documented.

## 2024-02-14 NOTE — H&P (Signed)
 Wyline Mood, MD [address], [place], [place], [address] [address], [place], [place], [address] Phone: [phone]  Fax: [phone]  Primary Care Physician:  Erasmo Downer, MD   Pre-Procedure History & Physical: HPI:  Edward Hurley is a 48 y.o. male is here for an colonoscopy.   Past Medical History:  Diagnosis Date   Allergy    Anxiety    Diabetes (HCC)    Graves disease 05/07/2017   Headache    Heart murmur    childhood   Hypertension    Hyperthyroidism    Irritability    Sleep apnea    CPAP   Umbilical hernia     Past Surgical History:  Procedure Laterality Date   NO PAST SURGERIES      Prior to Admission medications   Medication Sig Start Date End Date Taking? Authorizing Provider  clonazePAM (KLONOPIN) 0.5 MG tablet Take 1 tablet (0.5 mg total) by mouth 2 (two) times daily as needed for anxiety. 12/02/23  Yes Bacigalupo, [name], MD  insulin degludec (TRESIBA FLEXTOUCH) 100 UNIT/ML FlexTouch Pen Inject 10 Units into the skin daily. 01/09/24  Yes Bacigalupo, [name], MD  levothyroxine (SYNTHROID) 112 MCG tablet TAKE 2 TABLETS (224 MCG TOTAL) BY MOUTH DAILY. EXCEPT ON MONDAYS, TAKE 3 TABLETS 08/03/23  Yes Bacigalupo, [name], MD  Multiple Vitamin (MULTI-VITAMINS) TABS Take by mouth.   Yes [provider]  sertraline (ZOLOFT) 100 MG tablet TAKE 2 TABLETS BY MOUTH EVERY DAY 01/18/24  Yes Bacigalupo, [name], MD  Blood Glucose Monitoring Suppl (ONE TOUCH ULTRA 2) w/Device KIT Use as directed 12/02/23   Erasmo Downer, MD  empagliflozin (JARDIANCE) 25 MG TABS tablet Take 1 tablet (25 mg total) by mouth daily before breakfast. Patient not taking: Reported on 02/07/2024 01/17/24   Erasmo Downer, MD  losartan (COZAAR) 100 MG tablet TAKE 1 TABLET BY MOUTH EVERY DAY 02/09/24   Bacigalupo, [name], MD  OneTouch Delica Lancets 33G MISC Use as directed 08/01/23   Erasmo Downer, MD  [facility] ULTRA TEST test  strip USE AS INSTRUCTED TO CHECK BLOOD GLUCOSE 08/03/23   Erasmo Downer, MD  rosuvastatin (CRESTOR) 5 MG tablet TAKE 1 TABLET (5 MG TOTAL) BY MOUTH DAILY. 02/09/24   Erasmo Downer, MD    Allergies as of 01/30/2024   (No Known Allergies)    Family History  Problem Relation Age of Onset   Anxiety disorder Mother    Hyperlipidemia Father    Hypertension Father    Alcoholism Maternal Uncle    Mental illness Maternal Uncle    Prostate cancer Paternal Uncle    Thyroid disease Neg Hx    Breast cancer Neg Hx    Colon cancer Neg Hx     Social History   Socioeconomic History   Marital status: Married    Spouse name: Morrie Sheldon   Number of children: 2   Years of education: Not on file   Highest education level: Not on file  Occupational History   Occupation: childcare  Tobacco Use   Smoking status: Light Smoker    Types: Cigarettes   Smokeless tobacco: Never   Tobacco comments:    1-2 cig per day, on weekend 4-5 cigs (for about 10 yrs)  Vaping Use   Vaping status: Never Used  Substance and Sexual Activity   Alcohol use: Yes    Comment: socially, not regularly   Drug use: No  Sexual activity: Yes    Partners: Female    Comment: wife with Depo shots  Other Topics Concern   Not on file  Social History Narrative   Not on file   Social Drivers of Health   Financial Resource Strain: Not on file  Food Insecurity: Not on file  Transportation Needs: Not on file  Physical Activity: Not on file  Stress: Not on file  Social Connections: Not on file  Intimate Partner Violence: Not on file    Review of Systems: See HPI, otherwise negative ROS  Physical Exam: Ht 6' (1.829 m)   Wt 115.7 kg   BMI 34.58 kg/m  General:   Alert,  pleasant and cooperative in NAD Head:  Normocephalic and atraumatic. Neck:  Supple; no masses or thyromegaly. Lungs:  Clear throughout to auscultation, normal respiratory effort.    Heart:  +S1, +S2, Regular rate and rhythm, No  edema. Abdomen:  Soft, nontender and nondistended. Normal bowel sounds, without guarding, and without rebound.   Neurologic:  Alert and  oriented x4;  grossly normal neurologically.  Impression/Plan: Edward Hurley is here for an colonoscopy to be performed for Screening colonoscopy average risk   Risks, benefits, limitations, and alternatives regarding  colonoscopy have been reviewed with the patient.  Questions have been answered.  All parties agreeable.   Wyline Mood, MD  02/14/2024, 7:47 AM

## 2024-02-14 NOTE — Op Note (Signed)
 [facility]astroenterology Patient Name: Edward Hurley Procedure Date: 02/14/2024 8:07 AM MRN: [id number] Account #: [id number] Date of Birth: Dec 09, 1975 Admit Type: Outpatient Age: 48 Room: [facility] ENDO ROOM 2 Gender: Male Note Status: Finalized Instrument Name: Nelda Marseille [id number] Procedure:             Colonoscopy Indications:           Screening for colorectal malignant neoplasm Providers:             Wyline Mood MD, MD Referring MD:          Edward Hurley (Referring MD) Medicines:             Monitored Anesthesia Care Complications:         No immediate complications. Procedure:             Pre-Anesthesia Assessment:                        - Prior to the procedure, a History and Physical was                         performed, and patient medications, allergies and                         sensitivities were reviewed. The patient's tolerance                         of previous anesthesia was reviewed.                        - The risks and benefits of the procedure and the                         sedation options and risks were discussed with the                         patient. All questions were answered and informed                         consent was obtained.                        - ASA Grade Assessment: II - A patient with mild                         systemic disease.                        After obtaining informed consent, the colonoscope was                         passed under direct vision. Throughout the procedure,                         the patient's blood pressure, pulse, and oxygen                         saturations were monitored continuously. The                         Colonoscope was introduced  through the anus and                         advanced to the the cecum, identified by the                         appendiceal orifice. The colonoscopy was performed                         with ease. The patient tolerated the procedure well.                          The quality of the bowel preparation was excellent.                         The ileocecal valve, appendiceal orifice, and rectum                         were photographed. Findings:      The perianal and digital rectal examinations were normal.      Multiple small-mouthed diverticula were found in the left colon.      The exam was otherwise without abnormality on direct and retroflexion       views. Impression:            - Diverticulosis in the left colon.                        - The examination was otherwise normal on direct and                         retroflexion views.                        - No specimens collected. Recommendation:        - Discharge patient to home (with escort).                        - Resume previous diet.                        - Continue present medications.                        - Repeat colonoscopy in 10 years for screening                         purposes. Procedure Code(s):     --- Professional ---                        [phone], Colonoscopy, flexible; diagnostic, including                         collection of specimen(s) by brushing or washing, when                         performed (separate procedure) Diagnosis Code(s):     --- Professional ---                        Z12.11, Encounter for screening for malignant  neoplasm                         of colon                        K57.30, Diverticulosis of large intestine without                         perforation or abscess without bleeding CPT copyright 2022 American Medical Association. All rights reserved. The codes documented in this report are preliminary and upon coder review may  be revised to meet current compliance requirements. Wyline Mood, MD Wyline Mood MD, MD 02/14/2024 8:34:07 AM This report has been signed electronically. Number of Addenda: 0 Note Initiated On: 02/14/2024 8:07 AM Scope Withdrawal Time: 0 hours 9 minutes 50 seconds  Total Procedure Duration: 0  hours 12 minutes 20 seconds  Estimated Blood Loss:  Estimated blood loss: none.      [facility]

## 2024-02-14 NOTE — Anesthesia Procedure Notes (Signed)
 Date/Time: 02/14/2024 8:16 AM  Performed by: Malva Cogan, CRNAPre-anesthesia Checklist: Patient identified, Emergency Drugs available, Suction available, Patient being monitored and Timeout performed Patient Re-evaluated:Patient Re-evaluated prior to induction Oxygen Delivery Method: Nasal cannula Induction Type: IV induction Placement Confirmation: CO2 detector and positive ETCO2

## 2024-02-14 NOTE — Anesthesia Postprocedure Evaluation (Signed)
 Anesthesia Post Note  Patient: Edward Hurley  Procedure(s) Performed: COLONOSCOPY  Patient location during evaluation: PACU Anesthesia Type: General Level of consciousness: awake and alert, oriented and patient cooperative Pain management: pain level controlled Vital Signs Assessment: post-procedure vital signs reviewed and stable Respiratory status: spontaneous breathing, nonlabored ventilation and respiratory function stable Cardiovascular status: blood pressure returned to baseline and stable Postop Assessment: adequate PO intake Anesthetic complications: no   No notable events documented.   Last Vitals:  Vitals:   02/14/24 0843 02/14/24 0853  BP: (!) 141/102 (!) 171/102  Pulse:    Resp:    Temp:    SpO2:      Last Pain:  Vitals:   02/14/24 0853  TempSrc:   PainSc: 0-No pain                 Reed Breech

## 2024-02-15 ENCOUNTER — Encounter: Payer: Self-pay | Admitting: Gastroenterology

## 2024-02-16 MED ORDER — ATORVASTATIN CALCIUM 10 MG PO TABS: 10.0000 mg | ORAL_TABLET | Freq: Every day | ORAL | 3 refills | Status: DC

## 2024-02-16 NOTE — Addendum Note (Signed)
 Addended by: Jacquenette Shone on: 02/16/2024 08:08 AM   Modules accepted: Orders

## 2024-02-17 NOTE — Telephone Encounter (Signed)
PA has been approved and documented in separate encounter, please sign off on rx in this encounter as PA team is unable to resolve RX requests. Thank you

## 2024-02-20 ENCOUNTER — Other Ambulatory Visit: Payer: Self-pay

## 2024-02-20 MED ORDER — EMPAGLIFLOZIN 25 MG PO TABS: 25.0000 mg | ORAL_TABLET | Freq: Every day | ORAL | 1 refills | Status: DC

## 2024-02-28 ENCOUNTER — Ambulatory Visit: Payer: 59 | Admitting: Family Medicine

## 2024-02-28 VITALS — BP 149/106 | HR 65 | Ht 72.0 in | Wt 249.3 lb

## 2024-02-28 DIAGNOSIS — F411 Generalized anxiety disorder: Secondary | ICD-10-CM

## 2024-02-28 DIAGNOSIS — Z794 Long term (current) use of insulin: Secondary | ICD-10-CM

## 2024-02-28 DIAGNOSIS — I152 Hypertension secondary to endocrine disorders: Secondary | ICD-10-CM

## 2024-02-28 DIAGNOSIS — E1169 Type 2 diabetes mellitus with other specified complication: Secondary | ICD-10-CM

## 2024-02-28 DIAGNOSIS — E1159 Type 2 diabetes mellitus with other circulatory complications: Secondary | ICD-10-CM | POA: Diagnosis not present

## 2024-02-28 DIAGNOSIS — E890 Postprocedural hypothyroidism: Secondary | ICD-10-CM

## 2024-02-28 DIAGNOSIS — R41840 Attention and concentration deficit: Secondary | ICD-10-CM

## 2024-02-28 DIAGNOSIS — R809 Proteinuria, unspecified: Secondary | ICD-10-CM

## 2024-02-28 DIAGNOSIS — E1129 Type 2 diabetes mellitus with other diabetic kidney complication: Secondary | ICD-10-CM | POA: Diagnosis not present

## 2024-02-28 DIAGNOSIS — E785 Hyperlipidemia, unspecified: Secondary | ICD-10-CM

## 2024-02-28 MED ORDER — ATORVASTATIN CALCIUM 20 MG PO TABS: 20.0000 mg | ORAL_TABLET | Freq: Every day | ORAL | 1 refills | Status: DC

## 2024-02-28 MED ORDER — BUPROPION HCL ER (XL) 150 MG PO TB24: 150.0000 mg | ORAL_TABLET | Freq: Every day | ORAL | 2 refills | Status: DC

## 2024-02-28 MED ORDER — HYDROCHLOROTHIAZIDE 12.5 MG PO TABS: 12.5000 mg | ORAL_TABLET | Freq: Every day | ORAL | 3 refills | Status: DC

## 2024-02-28 MED ORDER — TRESIBA FLEXTOUCH 100 UNIT/ML ~~LOC~~ SOPN: 12.0000 [IU] | PEN_INJECTOR | Freq: Every day | SUBCUTANEOUS | 1 refills | Status: DC

## 2024-02-28 NOTE — Assessment & Plan Note (Signed)
 Previously on rosuvastatin, which was effective, but insurance no longer covers it. Will switch to atorvastatin 20 mg, which is equivalent to the previous dose of rosuvastatin and is more affordable. Atorvastatin is available at a lower cost at [facility]. - Prescribe atorvastatin 20 mg daily. - Pick up atorvastatin from [facility].

## 2024-02-28 NOTE — Assessment & Plan Note (Signed)
 Currently managed with Synthroid 224 mcg daily, except 336 mcg on Mondays. Well controlled on last check Continue to monitor

## 2024-02-28 NOTE — Progress Notes (Signed)
 Established patient visit   Patient: Edward Hurley   DOB: 05/22/1976   48 y.o. Male  MRN: [id number] Visit Date: 02/28/2024  Today's healthcare provider: Shirlee Latch, MD   Chief Complaint  Patient presents with   Follow-up    Due for foot exam   Diabetes    No concerns, pt checks sugars at home 100-140 normal range after eating, pt has tried to cut back on sugars, carbs, and starches   Hypertension    Pt checks at occasionally at home and has noticed it being slightly elevated in the 140's    Medication Refill    London Pepper is not being covered under pt insurance, would like to discuss alternative medication if possible   Subjective    HPI HPI     Follow-up    Additional comments: Due for foot exam        Diabetes    Additional comments: No concerns, pt checks sugars at home 100-140 normal range after eating, pt has tried to cut back on sugars, carbs, and starches        Hypertension    Additional comments: Pt checks at occasionally at home and has noticed it being slightly elevated in the 140's         Medication Refill    Additional comments: London Pepper is not being covered under pt insurance, would like to discuss alternative medication if possible      Last edited by Allayne Stack on 02/28/2024 10:55 AM.       Discussed the use of AI scribe software for clinical note transcription with the patient, who gave verbal consent to proceed.  History of Present Illness   The patient, who has a history of diabetes, hypertension, and anxiety, reports some improvement in his blood sugar levels since the last visit. He has been managing his diabetes with Tresiba (10 units), and his fasting blood sugar levels have ranged between 92 and 145. However, the patient's blood pressure remains consistently high despite being on Losartan (100mg ).  The patient also reports a lack of motivation and constant tiredness, which he attributes to his mental health. He is  currently on Zoloft (200mg ) and Clonazepam for anxiety. The patient has been on leave from work, which he believes has reduced his stress levels. He is considering neuropsych testing for potential ADHD, as he has noticed symptoms that align with the condition.  The patient's cholesterol was well-managed on Rosuvastatin, but due to insurance issues, he is switching to Atorvastatin. He has not yet started the new medication.         Medications: Outpatient Medications Prior to Visit  Medication Sig Note   Blood Glucose Monitoring Suppl (ONE TOUCH ULTRA 2) w/Device KIT Use as directed    clonazePAM (KLONOPIN) 0.5 MG tablet Take 1 tablet (0.5 mg total) by mouth 2 (two) times daily as needed for anxiety.    levothyroxine (SYNTHROID) 112 MCG tablet TAKE 2 TABLETS (224 MCG TOTAL) BY MOUTH DAILY. EXCEPT ON MONDAYS, TAKE 3 TABLETS    losartan (COZAAR) 100 MG tablet TAKE 1 TABLET BY MOUTH EVERY DAY    Multiple Vitamin (MULTI-VITAMINS) TABS Take by mouth.    OneTouch Delica Lancets 33G MISC Use as directed    ONETOUCH ULTRA TEST test strip USE AS INSTRUCTED TO CHECK BLOOD GLUCOSE    sertraline (ZOLOFT) 100 MG tablet TAKE 2 TABLETS BY MOUTH EVERY DAY    [DISCONTINUED] insulin degludec (TRESIBA FLEXTOUCH) 100 UNIT/ML FlexTouch Pen Inject  10 Units into the skin daily.    [DISCONTINUED] atorvastatin (LIPITOR) 10 MG tablet Take 1 tablet (10 mg total) by mouth daily. (Patient not taking: Reported on 02/28/2024) 02/28/2024: Pt has not yet started but is picking up refill today   [DISCONTINUED] empagliflozin (JARDIANCE) 25 MG TABS tablet Take 1 tablet (25 mg total) by mouth daily before breakfast. (Patient not taking: Reported on 02/28/2024) 02/28/2024: Might need alternative prescription because of insurance    No facility-administered medications prior to visit.    Review of Systems     Objective    BP (!) 149/106   Pulse 65   Ht 6' (1.829 m)   Wt 249 lb 4.8 oz (113.1 kg)   SpO2 98%   BMI 33.81 kg/m     Physical Exam Vitals reviewed.  Constitutional:      General: He is not in acute distress.    Appearance: Normal appearance. He is not diaphoretic.  HENT:     Head: Normocephalic and atraumatic.  Eyes:     General: No scleral icterus.    Conjunctiva/sclera: Conjunctivae normal.  Cardiovascular:     Rate and Rhythm: Normal rate and regular rhythm.     Heart sounds: Normal heart sounds. No murmur heard. Pulmonary:     Effort: Pulmonary effort is normal. No respiratory distress.     Breath sounds: Normal breath sounds. No wheezing or rhonchi.  Musculoskeletal:     Cervical back: Neck supple.     Right lower leg: No edema.     Left lower leg: No edema.  Lymphadenopathy:     Cervical: No cervical adenopathy.  Skin:    General: Skin is warm and dry.     Findings: No rash.  Neurological:     Mental Status: He is alert and oriented to person, place, and time. Mental status is at baseline.  Psychiatric:        Mood and Affect: Affect is flat.        Behavior: Behavior normal.      No results found for any visits on 02/28/24.  Assessment & Plan     Problem List Items Addressed This Visit       Cardiovascular and Mediastinum   Hypertension associated with diabetes (HCC) - Primary   Blood pressure remains consistently high despite maximum dose of losartan (100 mg daily). Plan to add hydrochlorothiazide to improve blood pressure control. Hydrochlorothiazide will help remove excess sodium and fluid, potentially causing mild diuresis. - Add hydrochlorothiazide 12.5 mg daily. - Follow up in six weeks to assess blood pressure control.      Relevant Medications   atorvastatin (LIPITOR) 20 MG tablet   insulin degludec (TRESIBA FLEXTOUCH) 100 UNIT/ML FlexTouch Pen   hydrochlorothiazide (HYDRODIURIL) 12.5 MG tablet     Endocrine   Postablative hypothyroidism   Currently managed with Synthroid 224 mcg daily, except 336 mcg on Mondays. Well controlled on last check Continue to  monitor      Type 2 diabetes mellitus (HCC)   Blood glucose levels have improved with Edward Hurley, with fasting levels ranging from 92 to 145 mg/dL. London Pepper is not affordable due to high copay. Discussed adding oral medications like Actos or glipizide, but these have potential side effects such as heart failure with Actos and hypoglycemia with glipizide. Decided to increase Tresiba to 12 units to achieve fasting glucose consistently under 120 mg/dL. A1c will be rechecked after Apr 15, 2024, as insurance requires a three-month interval between tests. - Increase Tresiba to  12 units daily. - Recheck A1c after Apr 15, 2024.      Relevant Medications   atorvastatin (LIPITOR) 20 MG tablet   insulin degludec (TRESIBA FLEXTOUCH) 100 UNIT/ML FlexTouch Pen   Hyperlipidemia associated with type 2 diabetes mellitus (HCC)   Previously on rosuvastatin, which was effective, but insurance no longer covers it. Will switch to atorvastatin 20 mg, which is equivalent to the previous dose of rosuvastatin and is more affordable. Atorvastatin is available at a lower cost at [facility]. - Prescribe atorvastatin 20 mg daily. - Pick up atorvastatin from [facility].      Relevant Medications   atorvastatin (LIPITOR) 20 MG tablet   insulin degludec (TRESIBA FLEXTOUCH) 100 UNIT/ML FlexTouch Pen   hydrochlorothiazide (HYDRODIURIL) 12.5 MG tablet   Microalbuminuria due to type 2 diabetes mellitus (HCC)   Continue losartan      Relevant Medications   atorvastatin (LIPITOR) 20 MG tablet   insulin degludec (TRESIBA FLEXTOUCH) 100 UNIT/ML FlexTouch Pen     Other   GAD (generalized anxiety disorder)   Reports persistent anxiety and low motivation, possibly exacerbated by ADHD. Currently on Zoloft and clonazepam. Discussed potential benefit of Wellbutrin for both depression and ADHD symptoms. Considering neuropsych testing for ADHD diagnosis. Wellbutrin is a non-stimulant that can aid in managing ADHD symptoms and synergizes with  Zoloft for depression and anxiety. - Start Wellbutrin extended-release in the morning. - Refer for neuropsychological testing for ADHD. - Follow up after Apr 15, 2024, to assess response to Wellbutrin.       Relevant Medications   buPROPion (WELLBUTRIN XL) 150 MG 24 hr tablet   Other Visit Diagnoses       Concentration deficit       Relevant Orders   Ambulatory referral to Neuropsychology        Return in about 2 months (around 04/29/2024) for chronic disease f/u.      Total time spent on today's visit was greater than 40 minutes, including both face-to-face time and nonface-to-face time personally spent on review of chart (labs and imaging), discussing labs and goals, discussing further work-up, treatment options, answering patient's questions, and coordinating care.   Shirlee Latch, MD  [facility] [phone] (phone) [phone] (fax)  [facility]

## 2024-02-28 NOTE — Assessment & Plan Note (Signed)
 Blood pressure remains consistently high despite maximum dose of losartan (100 mg daily). Plan to add hydrochlorothiazide to improve blood pressure control. Hydrochlorothiazide will help remove excess sodium and fluid, potentially causing mild diuresis. - Add hydrochlorothiazide 12.5 mg daily. - Follow up in six weeks to assess blood pressure control.

## 2024-02-28 NOTE — Assessment & Plan Note (Signed)
 Reports persistent anxiety and low motivation, possibly exacerbated by ADHD. Currently on Zoloft and clonazepam. Discussed potential benefit of Wellbutrin for both depression and ADHD symptoms. Considering neuropsych testing for ADHD diagnosis. Wellbutrin is a non-stimulant that can aid in managing ADHD symptoms and synergizes with Zoloft for depression and anxiety. - Start Wellbutrin extended-release in the morning. - Refer for neuropsychological testing for ADHD. - Follow up after Apr 15, 2024, to assess response to Wellbutrin.

## 2024-02-28 NOTE — Assessment & Plan Note (Signed)
 Blood glucose levels have improved with Edward Hurley, with fasting levels ranging from 92 to 145 mg/dL. Edward Hurley is not affordable due to high copay. Discussed adding oral medications like Actos or glipizide, but these have potential side effects such as heart failure with Actos and hypoglycemia with glipizide. Decided to increase Tresiba to 12 units to achieve fasting glucose consistently under 120 mg/dL. A1c will be rechecked after Apr 15, 2024, as insurance requires a three-month interval between tests. - Increase Tresiba to 12 units daily. - Recheck A1c after Apr 15, 2024.

## 2024-02-28 NOTE — Assessment & Plan Note (Signed)
 Continue losartan.

## 2024-03-07 ENCOUNTER — Ambulatory Visit: Admitting: Surgery

## 2024-03-21 ENCOUNTER — Other Ambulatory Visit: Payer: Self-pay | Admitting: Family Medicine

## 2024-03-24 NOTE — Telephone Encounter (Signed)
 Refilling for 90.  Requested Prescriptions  Pending Prescriptions Disp Refills   buPROPion  (WELLBUTRIN  XL) 150 MG 24 hr tablet [Pharmacy Med Name: BUPROPION  HCL XL 150 MG TABLET] 90 tablet 0    Sig: TAKE 1 TABLET BY MOUTH EVERY DAY     Psychiatry: Antidepressants - bupropion  Failed - 03/24/2024 10:50 AM      Failed - AST in normal range and within 360 days    AST  Date Value Ref Range Status  01/17/2024 49 (H) 0 - 40 IU/L Final         Failed - ALT in normal range and within 360 days    ALT  Date Value Ref Range Status  01/17/2024 79 (H) 0 - 44 IU/L Final         Failed - Last BP in normal range    BP Readings from Last 1 Encounters:  02/28/24 (!) 149/106         Passed - Cr in normal range and within 360 days    Creat  Date Value Ref Range Status  05/06/2017 0.89 0.60 - 1.35 mg/dL Final   Creatinine, Ser  Date Value Ref Range Status  01/17/2024 0.95 0.76 - 1.27 mg/dL Final         Passed - Valid encounter within last 6 months    Recent Outpatient Visits           3 weeks ago Hypertension associated with diabetes [facility])   [place] [facility] Louise, Stan Eans, MD   2 months ago Type 2 diabetes mellitus with diabetic microalbuminuria, without long-term current use of insulin [facility])   [place] [facility] Bacigalupo, Stan Eans, MD       Future Appointments             In 1 month Bacigalupo, Stan Eans, MD [name]

## 2024-03-31 ENCOUNTER — Other Ambulatory Visit: Payer: Self-pay | Admitting: Family Medicine

## 2024-04-03 ENCOUNTER — Encounter: Payer: Self-pay | Admitting: Family Medicine

## 2024-04-03 NOTE — Telephone Encounter (Signed)
 Requested Prescriptions  Pending Prescriptions Disp Refills   levothyroxine  (SYNTHROID ) 112 MCG tablet [Pharmacy Med Name: LEVOTHYROXINE  112 MCG TABLET] 192 tablet 1    Sig: TAKE 2 TABLETS (224 MCG TOTAL) BY MOUTH DAILY. EXCEPT ON MONDAYS, TAKE 3 TABLETS     Endocrinology:  Hypothyroid Agents Passed - 04/03/2024  7:52 AM      Passed - TSH in normal range and within 360 days    TSH  Date Value Ref Range Status  01/17/2024 2.280 0.450 - 4.500 uIU/mL Final         Passed - Valid encounter within last 12 months    Recent Outpatient Visits           1 month ago Hypertension associated with diabetes [facility])   [place] [facility] [place], Stan Eans, MD   2 months ago Type 2 diabetes mellitus with diabetic microalbuminuria, without long-term current use of insulin [facility])   [place] [facility] Bacigalupo, Stan Eans, MD       Future Appointments             In 3 weeks Bacigalupo, Stan Eans, MD [facility], [name]

## 2024-04-06 ENCOUNTER — Telehealth: Payer: Self-pay | Admitting: Family Medicine

## 2024-04-06 ENCOUNTER — Encounter: Payer: Self-pay | Admitting: Family Medicine

## 2024-04-06 NOTE — Telephone Encounter (Signed)
 Copied from CRM [id number]. Topic: Medical Record Request - Other >> Apr 06, 2024  2:46 PM Rosaria Common wrote: Reason for CRM: Employer paperwork needs to be picked ASAP sp pt can return to work on Monday 5/19. Please upload via MyChart as pt is not able to come pick them up today.

## 2024-04-06 NOTE — Telephone Encounter (Signed)
 LVM for patient informing him that the return to work release form was completed and ready for pick up on [address]. Signed by Dr. Athena Bland

## 2024-04-09 NOTE — Telephone Encounter (Signed)
 Odilia Bennett the spouse called back to see if the form can be emailed and after speaking with Sheryle Donning she said she can email it to her. I gave Sheryle Donning the email address and she will send it to her and I informed Odilia Bennett as well.

## 2024-04-15 ENCOUNTER — Other Ambulatory Visit: Payer: Self-pay | Admitting: Family Medicine

## 2024-04-18 NOTE — Telephone Encounter (Signed)
Please see the message below from the pt .

## 2024-04-19 NOTE — Telephone Encounter (Signed)
 Called pt to verify where to send since document did not have a fax number or where to send.  Faxed to [employer] at [phone] and copy left at front desk for pt.  Pt aware that paperwork has been faxed.  Stated that he will call tomorrow to make sure they received it.

## 2024-04-30 ENCOUNTER — Encounter: Payer: Self-pay | Admitting: Family Medicine

## 2024-04-30 ENCOUNTER — Ambulatory Visit: Admitting: Family Medicine

## 2024-04-30 VITALS — BP 158/100 | HR 71 | Ht 72.0 in | Wt 237.5 lb

## 2024-04-30 DIAGNOSIS — Z794 Long term (current) use of insulin: Secondary | ICD-10-CM

## 2024-04-30 DIAGNOSIS — E1159 Type 2 diabetes mellitus with other circulatory complications: Secondary | ICD-10-CM

## 2024-04-30 DIAGNOSIS — R809 Proteinuria, unspecified: Secondary | ICD-10-CM

## 2024-04-30 DIAGNOSIS — E1129 Type 2 diabetes mellitus with other diabetic kidney complication: Secondary | ICD-10-CM

## 2024-04-30 DIAGNOSIS — F411 Generalized anxiety disorder: Secondary | ICD-10-CM | POA: Diagnosis not present

## 2024-04-30 DIAGNOSIS — I152 Hypertension secondary to endocrine disorders: Secondary | ICD-10-CM

## 2024-04-30 LAB — POCT GLYCOSYLATED HEMOGLOBIN (HGB A1C): Hemoglobin A1C: 5.9 % — AB (ref 4.0–5.6)

## 2024-04-30 MED ORDER — BUPROPION HCL ER (XL) 300 MG PO TB24: 300.0000 mg | ORAL_TABLET | Freq: Every day | ORAL | 1 refills | Status: DC

## 2024-04-30 MED ORDER — HYDROCHLOROTHIAZIDE 25 MG PO TABS: 25.0000 mg | ORAL_TABLET | Freq: Every day | ORAL | 1 refills | Status: DC

## 2024-04-30 NOTE — Assessment & Plan Note (Signed)
 He experiences significant emotional changes, including increased crying and panic attacks. Wellbutrin  initially improved symptoms but effects have diminished, leading to a return of depressive symptoms. He is not suicidal but reports feeling very low. Increasing Wellbutrin  to 300 mg may help regain initial positive effects. He is open to this adjustment. Potential for increased emotional response during transition was discussed, and he is in a low-stress work environment, suitable for this change. - Increase Wellbutrin  to 300 mg once daily - Refer to psychiatry for further evaluation and management - Provide information for therapy resources

## 2024-04-30 NOTE — Patient Instructions (Signed)
 [facility] ([facility]) [phone]

## 2024-04-30 NOTE — Assessment & Plan Note (Signed)
 A1c improved from 10.2% to 5.9%, indicating excellent glycemic control. He manages diet by reducing sugar and starch intake, resulting in significant weight loss. Dietary guidance was provided to ensure balanced nutrition while maintaining glycemic control. - Continue current dietary management - Monitor blood glucose levels regularly

## 2024-04-30 NOTE — Progress Notes (Signed)
 Established patient visit   Patient: Edward Hurley   DOB: 06-19-1976   48 y.o. Male  MRN: [id number] Visit Date: 04/30/2024  Today's healthcare provider: Aden Agreste, MD   Chief Complaint  Patient presents with   Medical Management of Chronic Issues    Pt last seen 02/28/24   Anxiety    Patient was to start Wellbutrin  extended-release in the morning and was referred for neuropsychological testing for ADHD. Patient reports it has been interesting with some really high and some lows. Has not been contacted in reagrds to referral.    Hypertension    Patient was to add hydrochlorothiazide  12.5 mg daily to regimen. He reports tolerating well but feels it is still continuing to stay high with some high readings at home. No symptoms to report   Diabetes    Patient was to increase Tresiba  to 12 units daily. Patient reports tolerating well with no noticed se or concerns. Symptoms: nausea and no fatigueness in last 2 weeks   Hyperlipidemia    Pt was to pick up atorvastatin . Reports he did pick up and tolerating well. Some arm tingling and fingers    Subjective     HPI     Medical Management of Chronic Issues    Additional comments: Pt last seen 02/28/24        Anxiety    Additional comments: Patient was to start Wellbutrin  extended-release in the morning and was referred for neuropsychological testing for ADHD. Patient reports it has been interesting with some really high and some lows. Has not been contacted in reagrds to referral.         Hypertension    Additional comments: Patient was to add hydrochlorothiazide  12.5 mg daily to regimen. He reports tolerating well but feels it is still continuing to stay high with some high readings at home. No symptoms to report        Diabetes    Additional comments: Patient was to increase Tresiba  to 12 units daily. Patient reports tolerating well with no noticed se or concerns. Symptoms: nausea and no fatigueness in  last 2 weeks        Hyperlipidemia    Additional comments: Pt was to pick up atorvastatin . Reports he did pick up and tolerating well. Some arm tingling and fingers       Last edited by Pasty Bongo, CMA on 04/30/2024 10:52 AM.       Discussed the use of AI scribe software for clinical note transcription with the patient, who gave verbal consent to proceed.  History of Present Illness   Edward Hurley "Edward Hurley" is a 48 year old male with hypertension and ADHD who presents with emotional changes and uncontrolled blood pressure. He is accompanied by Bishop Bullock, his partner.  Over the past few weeks, he experiences significant emotional changes, including increased crying and panic attacks, which prevent him from returning to work. Wellbutrin  initially improved his concentration and mood, but its effects have diminished, leading to a significant dip in mood without suicidal ideation. The medication has reduced self-doubt and negative internal dialogue, though the lows remain concerning.  He is on a waitlist for ADHD testing and identifies with many symptoms, including memory issues and self-doubt. He has not accessed psychiatric services due to insurance issues.  He manages hypertension with losartan  100 mg and hydrochlorothiazide  12.5 mg. He reports no significant sodium intake and consumes caffeine without notable effects. He actively manages his diet, resulting in significant weight  loss and improved A1c from 10.2 to 5.9. He has lost approximately 20 pounds since February and has not experienced hypoglycemic episodes, with the lowest blood sugar reading being 86.  His current work involves physical activity, which he finds beneficial for his physical health and stress levels, allowing better focus than his previous job.           04/30/2024   10:56 AM 02/28/2024   10:58 AM 01/17/2024    3:18 PM 09/13/2022    4:11 PM  GAD 7 : Generalized Anxiety Score  Nervous, Anxious, on Edge 3 2  2 1   Control/stop worrying 2 2 1 1   Worry too much - different things 3 2 2 1   Trouble relaxing 2 3 2 1   Restless 1 0 0 0  Easily annoyed or irritable 2 2 2 1   Afraid - awful might happen 1 2 0 1  Total GAD 7 Score 14 13 9 6   Anxiety Difficulty Extremely difficult  Very difficult Somewhat difficult        04/30/2024   10:55 AM 02/28/2024   10:58 AM 01/17/2024    3:17 PM 01/07/2023   10:24 AM 09/30/2022    1:15 PM  Depression screen PHQ 2/9  Decreased Interest 2 2 2 1 1   Down, Depressed, Hopeless 2 2 2 1 1   PHQ - 2 Score 4 4 4 2 2   Altered sleeping 2 2 3 1 2   Tired, decreased energy 2 2 3 1 3   Change in appetite 1 1 1 2 3   Feeling bad or failure about yourself  2 2 2 1 1   Trouble concentrating 2 2 1 1 2   Moving slowly or fidgety/restless 0 0 1 0 0  Suicidal thoughts 1 0 1 0 0  PHQ-9 Score 14 13 16 8 13   Difficult doing work/chores Extremely dIfficult Very difficult Extremely dIfficult Somewhat difficult Very difficult    Medications: Outpatient Medications Prior to Visit  Medication Sig   atorvastatin  (LIPITOR) 20 MG tablet Take 1 tablet (20 mg total) by mouth daily.   Blood Glucose Monitoring Suppl (ONE TOUCH ULTRA 2) w/Device KIT Use as directed   clonazePAM  (KLONOPIN ) 0.5 MG tablet Take 1 tablet (0.5 mg total) by mouth 2 (two) times daily as needed for anxiety.   insulin degludec  (TRESIBA  FLEXTOUCH) 100 UNIT/ML FlexTouch Pen Inject 12 Units into the skin daily.   levothyroxine  (SYNTHROID ) 112 MCG tablet TAKE 2 TABLETS (224 MCG TOTAL) BY MOUTH DAILY. EXCEPT ON MONDAYS, TAKE 3 TABLETS   losartan  (COZAAR ) 100 MG tablet TAKE 1 TABLET BY MOUTH EVERY DAY   Multiple Vitamin (MULTI-VITAMINS) TABS Take by mouth.   OneTouch Delica Lancets 33G MISC Use as directed   ONETOUCH ULTRA TEST test strip USE AS INSTRUCTED TO CHECK BLOOD GLUCOSE   sertraline  (ZOLOFT ) 100 MG tablet TAKE 2 TABLETS BY MOUTH EVERY DAY   [DISCONTINUED] buPROPion  (WELLBUTRIN  XL) 150 MG 24 hr tablet TAKE 1 TABLET BY  MOUTH EVERY DAY   [DISCONTINUED] hydrochlorothiazide  (HYDRODIURIL ) 12.5 MG tablet Take 1 tablet (12.5 mg total) by mouth daily.   No facility-administered medications prior to visit.    Review of Systems     Objective    BP (!) 158/100 (BP Location: Left Arm, Patient Position: Sitting, Cuff Size: Large)   Pulse 71   Ht 6' (1.829 m)   Wt 237 lb 8 oz (107.7 kg)   SpO2 98%   BMI 32.21 kg/m    Physical Exam Vitals reviewed.  Constitutional:  General: He is not in acute distress.    Appearance: Normal appearance. He is not diaphoretic.  HENT:     Head: Normocephalic and atraumatic.  Eyes:     General: No scleral icterus.    Conjunctiva/sclera: Conjunctivae normal.  Cardiovascular:     Rate and Rhythm: Normal rate and regular rhythm.     Heart sounds: Normal heart sounds. No murmur heard. Pulmonary:     Effort: Pulmonary effort is normal. No respiratory distress.     Breath sounds: Normal breath sounds. No wheezing or rhonchi.  Musculoskeletal:     Cervical back: Neck supple.     Right lower leg: No edema.     Left lower leg: No edema.  Lymphadenopathy:     Cervical: No cervical adenopathy.  Skin:    General: Skin is warm and dry.     Findings: No rash.  Neurological:     Mental Status: He is alert and oriented to person, place, and time. Mental status is at baseline.  Psychiatric:        Mood and Affect: Mood normal.        Behavior: Behavior normal.      Results for orders placed or performed in visit on 04/30/24  POCT HgB A1C  Result Value Ref Range   Hemoglobin A1C 5.9 (A) 4.0 - 5.6 %   HbA1c POC (<> result, manual entry)     HbA1c, POC (prediabetic range)     HbA1c, POC (controlled diabetic range)      Assessment & Plan     Problem List Items Addressed This Visit       Cardiovascular and Mediastinum   Hypertension associated with diabetes (HCC)   Blood pressure is consistently elevated at 158/100 mmHg. Despite lifestyle changes, including  reduced sodium intake and increased physical activity, blood pressure remains uncontrolled. Anxiety and stress may contribute. Current regimen includes losartan  100 mg and hydrochlorothiazide  12.5 mg. Increasing hydrochlorothiazide  to 25 mg is planned to better manage blood pressure. Potential for further medication adjustments was discussed, considering genetic and lifestyle factors. - Increase hydrochlorothiazide  to 25 mg once daily - Recheck blood pressure in 6 weeks      Relevant Medications   hydrochlorothiazide  (HYDRODIURIL ) 25 MG tablet     Endocrine   Type 2 diabetes mellitus (HCC) - Primary   A1c improved from 10.2% to 5.9%, indicating excellent glycemic control. He manages diet by reducing sugar and starch intake, resulting in significant weight loss. Dietary guidance was provided to ensure balanced nutrition while maintaining glycemic control. - Continue current dietary management - Monitor blood glucose levels regularly      Relevant Orders   POCT HgB A1C (Completed)     Other   GAD (generalized anxiety disorder)   He experiences significant emotional changes, including increased crying and panic attacks. Wellbutrin  initially improved symptoms but effects have diminished, leading to a return of depressive symptoms. He is not suicidal but reports feeling very low. Increasing Wellbutrin  to 300 mg may help regain initial positive effects. He is open to this adjustment. Potential for increased emotional response during transition was discussed, and he is in a low-stress work environment, suitable for this change. - Increase Wellbutrin  to 300 mg once daily - Refer to psychiatry for further evaluation and management - Provide information for therapy resources      Relevant Medications   buPROPion  (WELLBUTRIN  XL) 300 MG 24 hr tablet   Other Relevant Orders   Ambulatory referral to Psychiatry  concentration deficit He suspects ADHD, identifying with symptoms such as  memory issues and difficulty focusing. He is on a waitlist for ADHD testing but has not been formally diagnosed. Psychiatry may conduct ADHD testing and initiate therapy as part of overall psychiatric health management. Potential for psychiatry to expedite testing and management was discussed. - Refer to psychiatry for ADHD testing and management - Provide information for neuropsychological testing      Return in about 6 weeks (around 06/11/2024) for BP f/u.       Aden Agreste, MD  [facility] [phone] (phone) [phone] (fax)  [facility]

## 2024-04-30 NOTE — Assessment & Plan Note (Signed)
 Blood pressure is consistently elevated at 158/100 mmHg. Despite lifestyle changes, including reduced sodium intake and increased physical activity, blood pressure remains uncontrolled. Anxiety and stress may contribute. Current regimen includes losartan  100 mg and hydrochlorothiazide  12.5 mg. Increasing hydrochlorothiazide  to 25 mg is planned to better manage blood pressure. Potential for further medication adjustments was discussed, considering genetic and lifestyle factors. - Increase hydrochlorothiazide  to 25 mg once daily - Recheck blood pressure in 6 weeks

## 2024-05-14 ENCOUNTER — Telehealth: Payer: Self-pay | Admitting: Family Medicine

## 2024-05-14 NOTE — Telephone Encounter (Signed)
 Short Term Disability paperwork was received on 05/14/2024 & placed in provider's mailbox. Please allow 7-10 business days to be completed. Thank you

## 2024-05-15 ENCOUNTER — Encounter: Payer: Self-pay | Admitting: Family Medicine

## 2024-05-15 DIAGNOSIS — Z0279 Encounter for issue of other medical certificate: Secondary | ICD-10-CM

## 2024-05-22 NOTE — Telephone Encounter (Signed)
 Received a fax from [employer] for additional information in questionnaire form.  Placed in Dr. Rona box.

## 2024-05-24 ENCOUNTER — Telehealth: Payer: Self-pay | Admitting: Family Medicine

## 2024-05-24 DIAGNOSIS — Z0279 Encounter for issue of other medical certificate: Secondary | ICD-10-CM

## 2024-05-24 NOTE — Telephone Encounter (Signed)
 Completed 2 sets of forms. Will leave at front desk to submit

## 2024-05-24 NOTE — Telephone Encounter (Signed)
 Long Term Disability paperwork was received on 05/24/2024 & placed in provider's mailbox. Please allow 7-10 business days to be completed. Thank you

## 2024-05-31 ENCOUNTER — Other Ambulatory Visit: Payer: Self-pay | Admitting: Family Medicine

## 2024-06-06 ENCOUNTER — Telehealth: Payer: Self-pay

## 2024-06-06 NOTE — Telephone Encounter (Signed)
 Copied from CRM [id number]. Topic: General - Other >> Jun 06, 2024  8:52 AM Myrick T wrote: Reason for CRM: Dr Floretta from [employer] requested a call back from provider to discuss the restrictions and limitations for patient.

## 2024-06-07 NOTE — Telephone Encounter (Signed)
 Responded to written communication from Dr Floretta

## 2024-06-11 MED ORDER — ONDANSETRON HCL 4 MG PO TABS: 4.0000 mg | ORAL_TABLET | Freq: Three times a day (TID) | ORAL | 0 refills | Status: DC | PRN

## 2024-06-11 NOTE — Addendum Note (Signed)
 Addended by: Emmalyn Hinson M on: 06/11/2024 02:35 PM   Modules accepted: Orders

## 2024-06-14 ENCOUNTER — Ambulatory Visit: Admitting: Family Medicine

## 2024-07-05 ENCOUNTER — Other Ambulatory Visit: Payer: Self-pay | Admitting: Family Medicine

## 2024-07-05 DIAGNOSIS — F411 Generalized anxiety disorder: Secondary | ICD-10-CM

## 2024-07-11 MED ORDER — CLONAZEPAM 0.5 MG PO TABS: 0.5000 mg | ORAL_TABLET | Freq: Two times a day (BID) | ORAL | 0 refills | Status: DC | PRN

## 2024-07-12 ENCOUNTER — Encounter: Payer: Self-pay | Admitting: Family Medicine

## 2024-07-12 ENCOUNTER — Ambulatory Visit: Admitting: Family Medicine

## 2024-07-12 VITALS — BP 136/86 | HR 75 | Ht 72.0 in | Wt 222.0 lb

## 2024-07-12 DIAGNOSIS — F411 Generalized anxiety disorder: Secondary | ICD-10-CM | POA: Diagnosis not present

## 2024-07-12 DIAGNOSIS — E1169 Type 2 diabetes mellitus with other specified complication: Secondary | ICD-10-CM

## 2024-07-12 DIAGNOSIS — Z794 Long term (current) use of insulin: Secondary | ICD-10-CM

## 2024-07-12 DIAGNOSIS — F321 Major depressive disorder, single episode, moderate: Secondary | ICD-10-CM

## 2024-07-12 DIAGNOSIS — E890 Postprocedural hypothyroidism: Secondary | ICD-10-CM

## 2024-07-12 DIAGNOSIS — E1129 Type 2 diabetes mellitus with other diabetic kidney complication: Secondary | ICD-10-CM

## 2024-07-12 DIAGNOSIS — E785 Hyperlipidemia, unspecified: Secondary | ICD-10-CM

## 2024-07-12 DIAGNOSIS — E1159 Type 2 diabetes mellitus with other circulatory complications: Secondary | ICD-10-CM | POA: Diagnosis not present

## 2024-07-12 DIAGNOSIS — M2021 Hallux rigidus, right foot: Secondary | ICD-10-CM

## 2024-07-12 DIAGNOSIS — R809 Proteinuria, unspecified: Secondary | ICD-10-CM

## 2024-07-12 DIAGNOSIS — I152 Hypertension secondary to endocrine disorders: Secondary | ICD-10-CM

## 2024-07-12 NOTE — Assessment & Plan Note (Signed)
-   Continue Synthroid  224 mcg daily except 336 mcg on Mondays

## 2024-07-12 NOTE — Assessment & Plan Note (Signed)
 No issues reported with current Lipitor regimen. - Continue Lipitor 20 mg daily

## 2024-07-12 NOTE — Assessment & Plan Note (Signed)
 Type 2 diabetes is well-controlled with current regimen. A1c was 5.9 in June. Blood sugars range between 86 and 120 mg/dL. No recent hypoglycemic episodes reported. - Continue Tresiba  12 units daily - Schedule A1c test in three months during physical exam - Consider reducing Tresiba  if A1c remains in the 5s

## 2024-07-12 NOTE — Assessment & Plan Note (Signed)
 Blood pressure initially elevated but decreased to 136/86 mmHg after rest. Home monitoring is not consistent. - Continue losartan  100 mg daily - Continue hydrochlorothiazide  25 mg daily - Encourage home blood pressure monitoring with an arm cuff - Bring wrist blood pressure monitor to next visit for comparison

## 2024-07-12 NOTE — Assessment & Plan Note (Signed)
 Symptoms of depression and anxiety are slightly improved. Wellbutrin  is helping, and L-tyrosine supplement is perceived as beneficial. Referral to psychiatry was not successful; he needs in-person evaluation. He is avoiding Adderall due to concerns about personality changes observed in a family member with ADHD. - Continue Wellbutrin  XL 300 mg daily - Continue sertraline  200 mg daily - Continue clonazepam  2.5 mg as needed - Provide contact information for psychiatry at [facility] hospital - Encourage follow-up with psychiatry for in-person evaluation

## 2024-07-12 NOTE — Progress Notes (Signed)
 Established patient visit   Patient: Edward Hurley   DOB: [birth year]   48 y.o. Male  MRN: [id number] Visit Date: 07/12/2024  Today's healthcare provider: Jon Eva, MD   Chief Complaint  Patient presents with   Depression    Would like his R big toe checked its been bothering him for years but its getting worst, not sure if it has anything to do with his diabetes    Subjective    Depression        HPI     Depression    Additional comments: Would like his R big toe checked its been bothering him for years but its getting worst, not sure if it has anything to do with his diabetes       Last edited by Thelbert Eulalio HERO, CMA on 07/12/2024  8:34 AM.       Discussed the use of AI scribe software for clinical note transcription with the patient, who gave verbal consent to proceed.  History of Present Illness   Edward Hurley is a 48 year old male with type 2 diabetes, post ablative hypothyroidism, and generalized anxiety disorder who presents for a follow-up visit.  He monitors his blood pressure at home, though not as frequently as recommended. He takes losartan  100 mg daily and hydrochlorothiazide  25 mg daily. Blood pressure decreases after settling in during visits.  Depression and anxiety symptoms have slightly improved with Wellbutrin  XL 300 mg daily, reducing 'tunnel vision' and 'noise'. He lacks excitement and happiness. He takes L-tyrosine, sertraline  200 mg daily, and clonazepam  2.5 mg as needed.  Type 2 diabetes is managed with Tresiba  12 units daily. Blood sugar levels range between 86 and 120 mg/dL, with no significant hypoglycemic episodes. He has reduced sugar intake and uses sugar-free pudding to manage cravings. His last A1c was 5.9 in June.  He is on Synthroid  224 mcg daily, except 336 mcg on Mondays, for post ablative hypothyroidism, with no issues.  He has a swollen and rigid toe joint, worsening over the past few months. He  takes ibuprofen a couple of times a day for relief but prefers not to rely on it. Topical treatments have provided temporary success.  He experiences numbness in the first three fingers of his hand, possibly due to a pinched nerve in his neck or sleeping position. He also has shoulder and neck pain, which varies in intensity.          07/12/2024    9:11 AM 04/30/2024   10:56 AM 02/28/2024   10:58 AM 01/17/2024    3:18 PM  GAD 7 : Generalized Anxiety Score  Nervous, Anxious, on Edge 2 3 2 2   Control/stop worrying 2 2 2 1   Worry too much - different things 2 3 2 2   Trouble relaxing 2 2 3 2   Restless 1 1 0 0  Easily annoyed or irritable 2 2 2 2   Afraid - awful might happen 1 1 2  0  Total GAD 7 Score 12 14 13 9   Anxiety Difficulty Extremely difficult Extremely difficult  Very difficult        07/12/2024    9:07 AM 04/30/2024   10:55 AM 02/28/2024   10:58 AM 01/17/2024    3:17 PM 01/07/2023   10:24 AM  Depression screen PHQ 2/9  Decreased Interest 2 2 2 2 1   Down, Depressed, Hopeless 1 2 2 2 1   PHQ - 2 Score 3 4 4 4  2  Altered sleeping 1 2 2 3 1   Tired, decreased energy 2 2 2 3 1   Change in appetite 1 1 1 1 2   Feeling bad or failure about yourself  3 2 2 2 1   Trouble concentrating 3 2 2 1 1   Moving slowly or fidgety/restless 0 0 0 1 0  Suicidal thoughts 0 1 0 1 0  PHQ-9 Score 13 14 13 16 8   Difficult doing work/chores Extremely dIfficult Extremely dIfficult Very difficult Extremely dIfficult Somewhat difficult     Medications: Outpatient Medications Prior to Visit  Medication Sig   TYROSINE PO Take by mouth.   atorvastatin  (LIPITOR) 20 MG tablet Take 1 tablet (20 mg total) by mouth daily.   Blood Glucose Monitoring Suppl (ONE TOUCH ULTRA 2) w/Device KIT Use as directed   buPROPion  (WELLBUTRIN  XL) 300 MG 24 hr tablet Take 1 tablet (300 mg total) by mouth daily.   clonazePAM  (KLONOPIN ) 0.5 MG tablet Take 1 tablet (0.5 mg total) by mouth 2 (two) times daily as needed for anxiety.    hydrochlorothiazide  (HYDRODIURIL ) 25 MG tablet Take 1 tablet (25 mg total) by mouth daily.   insulin degludec  (TRESIBA  FLEXTOUCH) 100 UNIT/ML FlexTouch Pen Inject 12 Units into the skin daily.   levothyroxine  (SYNTHROID ) 112 MCG tablet TAKE 2 TABLETS (224 MCG TOTAL) BY MOUTH DAILY. EXCEPT ON MONDAYS, TAKE 3 TABLETS   losartan  (COZAAR ) 100 MG tablet TAKE 1 TABLET BY MOUTH EVERY DAY   Multiple Vitamin (MULTI-VITAMINS) TABS Take by mouth.   ondansetron  (ZOFRAN ) 4 MG tablet Take 1 tablet (4 mg total) by mouth every 8 (eight) hours as needed for nausea or vomiting.   OneTouch Delica Lancets 33G MISC Use as directed   ONETOUCH ULTRA TEST test strip USE AS INSTRUCTED TO CHECK BLOOD GLUCOSE   sertraline  (ZOLOFT ) 100 MG tablet TAKE 2 TABLETS BY MOUTH EVERY DAY   No facility-administered medications prior to visit.    Review of Systems  Psychiatric/Behavioral:  Positive for depression.        Objective    BP 136/86 (BP Location: Left Arm, Patient Position: Sitting, Cuff Size: Large)   Pulse 75   Ht 6' (1.829 m)   Wt 222 lb (100.7 kg)   SpO2 100%   BMI 30.11 kg/m    Physical Exam Vitals reviewed.  Constitutional:      General: He is not in acute distress.    Appearance: Normal appearance. He is not diaphoretic.  HENT:     Head: Normocephalic and atraumatic.  Eyes:     General: No scleral icterus.    Conjunctiva/sclera: Conjunctivae normal.  Cardiovascular:     Rate and Rhythm: Normal rate and regular rhythm.     Heart sounds: Normal heart sounds. No murmur heard. Pulmonary:     Effort: Pulmonary effort is normal. No respiratory distress.     Breath sounds: Normal breath sounds. No wheezing or rhonchi.  Musculoskeletal:     Cervical back: Neck supple.     Right lower leg: No edema.     Left lower leg: No edema.     Comments: Swelling of 1st MTP, inflexibility of the R great toe  Lymphadenopathy:     Cervical: No cervical adenopathy.  Skin:    General: Skin is warm and dry.      Findings: No rash.  Neurological:     Mental Status: He is alert and oriented to person, place, and time. Mental status is at baseline.  Psychiatric:  Mood and Affect: Mood normal.        Behavior: Behavior normal.      No results found for any visits on 07/12/24.  Assessment & Plan     Problem List Items Addressed This Visit       Cardiovascular and Mediastinum   Hypertension associated with diabetes (HCC) - Primary   Blood pressure initially elevated but decreased to 136/86 mmHg after rest. Home monitoring is not consistent. - Continue losartan  100 mg daily - Continue hydrochlorothiazide  25 mg daily - Encourage home blood pressure monitoring with an arm cuff - Bring wrist blood pressure monitor to next visit for comparison        Endocrine   Postablative hypothyroidism   - Continue Synthroid  224 mcg daily except 336 mcg on Mondays      Type 2 diabetes mellitus (HCC)   Type 2 diabetes is well-controlled with current regimen. A1c was 5.9 in June. Blood sugars range between 86 and 120 mg/dL. No recent hypoglycemic episodes reported. - Continue Tresiba  12 units daily - Schedule A1c test in three months during physical exam - Consider reducing Tresiba  if A1c remains in the 5s      Hyperlipidemia associated with type 2 diabetes mellitus (HCC)   No issues reported with current Lipitor regimen. - Continue Lipitor 20 mg daily        Other   GAD (generalized anxiety disorder)   Symptoms of depression and anxiety are slightly improved. Wellbutrin  is helping, and L-tyrosine supplement is perceived as beneficial. Referral to psychiatry was not successful; he needs in-person evaluation. He is avoiding Adderall due to concerns about personality changes observed in a family member with ADHD. - Continue Wellbutrin  XL 300 mg daily - Continue sertraline  200 mg daily - Continue clonazepam  2.5 mg as needed - Provide contact information for psychiatry at [facility] -  Encourage follow-up with psychiatry for in-person evaluation      Current moderate episode of major depressive disorder without prior episode (HCC)   Symptoms of depression and anxiety are slightly improved. Wellbutrin  is helping, and L-tyrosine supplement is perceived as beneficial. Referral to psychiatry was not successful; he needs in-person evaluation. He is avoiding Adderall due to concerns about personality changes observed in a family member with ADHD. 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 Ibuprofen provides some relief but is not preferred long-term. He is concerned about the impact of a steroid injection on his ability to work, but it is not expected to worsen symptoms. - Refer to podiatry for possible steroid injection into the joint  Possible left carpal tunnel syndrome Intermittent numbness in the first three fingers of the left hand, possibly due to carpal tunnel syndrome or cervical radiculopathy. Symptoms may be exacerbated by sleeping position or phone use. - Recommend using a wrist brace during sleep to alleviate symptoms - Consider further evaluation if symptoms persist or worsen  Follow-Up Follow-up plans discussed for ongoing management and evaluation. - Schedule physical exam in three months  with labs - Follow up with psychiatry and podiatry as needed        Return in about 3 months (around 10/12/2024) for CPE.       Jon Eva, MD  [facility] [phone] (phone) [phone]  (fax)  [facility]

## 2024-07-20 ENCOUNTER — Other Ambulatory Visit: Payer: Self-pay | Admitting: Family Medicine

## 2024-07-21 ENCOUNTER — Other Ambulatory Visit: Payer: Self-pay | Admitting: Family Medicine

## 2024-07-23 NOTE — Telephone Encounter (Signed)
 Requested Prescriptions  Pending Prescriptions Disp Refills   sertraline  (ZOLOFT ) 100 MG tablet [Pharmacy Med Name: SERTRALINE  HCL 100 MG TABLET] 180 tablet 0    Sig: TAKE 2 TABLETS BY MOUTH EVERY DAY     Psychiatry:  Antidepressants - SSRI - sertraline  Failed - 07/23/2024 11:57 AM      Failed - AST in normal range and within 360 days    AST  Date Value Ref Range Status  01/17/2024 49 (H) 0 - 40 IU/L Final         Failed - ALT in normal range and within 360 days    ALT  Date Value Ref Range Status  01/17/2024 79 (H) 0 - 44 IU/L Final         Passed - Completed PHQ-2 or PHQ-9 in the last 360 days      Passed - Valid encounter within last 6 months    Recent Outpatient Visits           1 week ago Hypertension associated with diabetes [facility])   [place] [place], Jon HERO, MD   2 months ago Type 2 diabetes mellitus with diabetic microalbuminuria, without long-term current use of insulin [facility])   [facility] [place], Jon HERO, MD   4 months ago Hypertension associated with diabetes [facility])   [place] [place], Jon HERO, MD   6 months ago Type 2 diabetes mellitus with diabetic microalbuminuria, without long-term current use of insulin [facility])   [facility] [place], Jon HERO, MD       Future Appointments             In 3 months Bacigalupo, Jon HERO, MD [facility] [place]

## 2024-07-25 ENCOUNTER — Other Ambulatory Visit: Payer: Self-pay | Admitting: Physician Assistant

## 2024-07-25 DIAGNOSIS — F411 Generalized anxiety disorder: Secondary | ICD-10-CM

## 2024-07-30 ENCOUNTER — Other Ambulatory Visit: Payer: Self-pay | Admitting: Family Medicine

## 2024-07-30 DIAGNOSIS — E1169 Type 2 diabetes mellitus with other specified complication: Secondary | ICD-10-CM

## 2024-08-29 ENCOUNTER — Ambulatory Visit: Payer: PRIVATE HEALTH INSURANCE | Admitting: Psychology

## 2024-08-29 DIAGNOSIS — F89 Unspecified disorder of psychological development: Secondary | ICD-10-CM | POA: Diagnosis not present
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
## 2024-09-06 ENCOUNTER — Ambulatory Visit: Admitting: Psychology

## 2024-09-06 NOTE — Progress Notes (Signed)
 Patient rescheduled the appointment.            Frederic ONEIDA Fire, PsyD

## 2024-09-20 ENCOUNTER — Ambulatory Visit (INDEPENDENT_AMBULATORY_CARE_PROVIDER_SITE_OTHER): Admitting: Psychology

## 2024-09-20 DIAGNOSIS — F89 Unspecified disorder of psychological development: Secondary | ICD-10-CM
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
## 2024-09-26 ENCOUNTER — Encounter: Payer: Self-pay | Admitting: Family Medicine

## 2024-09-26 NOTE — Telephone Encounter (Signed)
 Ok to refill his rx for zofran  for #20 r3

## 2024-09-27 MED ORDER — ONDANSETRON HCL 4 MG PO TABS: 4.0000 mg | ORAL_TABLET | Freq: Three times a day (TID) | ORAL | 3 refills | Status: AC | PRN

## 2024-10-03 ENCOUNTER — Ambulatory Visit: Payer: Self-pay | Admitting: Psychology

## 2024-10-03 NOTE — Progress Notes (Unsigned)
? ? ? ? ? ? ? ? ? ? ? ? ? ? ?  Onnie Alatorre T Cayle Cordoba, PsyD ?

## 2024-10-05 ENCOUNTER — Other Ambulatory Visit: Payer: Self-pay | Admitting: Family Medicine

## 2024-10-05 DIAGNOSIS — F411 Generalized anxiety disorder: Secondary | ICD-10-CM

## 2024-10-25 ENCOUNTER — Encounter: Admitting: Family Medicine

## 2024-10-25 ENCOUNTER — Telehealth (INDEPENDENT_AMBULATORY_CARE_PROVIDER_SITE_OTHER): Payer: PRIVATE HEALTH INSURANCE | Admitting: Psychology

## 2024-10-25 DIAGNOSIS — F331 Major depressive disorder, recurrent, moderate: Secondary | ICD-10-CM

## 2024-10-25 DIAGNOSIS — F411 Generalized anxiety disorder: Secondary | ICD-10-CM | POA: Diagnosis not present

## 2024-10-25 NOTE — Telephone Encounter (Addendum)
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 global problems with self-regulation are adversely affecting active cognitive problem-solving (elevated CRI).   Scale/Index  Raw Score T Score Percentile Qualitative Description  Inhibit 18 77 >99 Highly Elevated  Self-Monitor 15 81 >99 Highly Elevated  Behavior Regulation Index (BRI) 33 83 >99 Highly Elevated  Shift 14 75 >99 Highly Elevated  Emotional Control 19 77 98 Highly Elevated  Emotion Regulation Index (ERI) 33 80 >99 Highly Elevated  Initiate 24 91 >99 Highly Elevated  Working Memory 22 88 >99 Highly Elevated  Plan/Organize 19 77 >99 Highly Elevated  Task Monitor 14 78 >99 Highly Elevated  Organization of Materials 17 68 95 Mildly Elevated  Cognitive Regulation Index (CRI) 96 85 >99 Highly Elevated  Global Executive Composite (GEC) 162 86 >99 Highly Elevated   Validity Scale Raw Score Cumulative Percentile Protocol Classification  Inconsistency 2 98 Acceptable  Negativity 1 98 Acceptable  Infrequency 0 98 Acceptable   BEHAVIORAL FUNCTIONING   Patient Health Questionnaire-9 (PHQ-9): Mr. Jablonsky completed the PHQ-9, a self-report measure of depressive symptoms. He scored 15/27, which indicates moderately  severe depression.   Generalized Anxiety Disorder-7 (GAD-7): Mr. Cavallaro completed the GAD-7, a self-report measure that assesses symptoms of anxiety. He scored 10/21, which indicates moderate anxiety.   Personality Assessment Inventory (PAI): The PAI is an objective inventory of adult personality. The validity indicators suggested Mr. Heims responded consistently to similar item content (ICN T = 46), attended appropriately to item content (INF T = 44), and did not appear to portray himself in an unrealistically favorable manner (PIM T = 27). However, his results suggest an element of exaggeration of complaints and problems (NIM T = 84). As such, his results may overrepresent the extent and degree of clinically significant test findings, and this evaluator only broadly interpreted them. He endorsed significant concerns about somatic functioning and probable impairment arising from somatic symptoms (SOM T = 81); significant anxiety and tension (ANX T = 82 And STR T = 75) and many symptoms associated with major depressive disorder (DEP T = 94), that may at least partially be explained by having experienced a traumatic event(s) that hew views himself as having been severely damaged by (ARD-T T = 92); an activity level that is noticeably high even to casual observers (MAN T = 68); being wary in relationships (PAR T = 67, NON T = 61, ) feeling misunderstood and alienated from others (SCZ T = 85), and seeking to relinquish control in relationships and preferring to approach them more passively (Dom T = 33); impulsivity and emotional lability (BORT T = 81);  and period thoughts of self-harm (SUI T = 64). He appears to acknowledge significant difficulties in functioning and perceives that help is needed in dealing with them (RXR T = 29). Upon follow-up, Mr. Lashomb shared that he is prone to being overly critical of himself but had tried to answer the PAI items honestly.     SUMMARY AND CLINICAL IMPRESSIONS: Mr. Vanaken is  a 48 year old male who was referred by Dr. Angela Hurley for an evaluation to determine if he currently meets criteria for a diagnosis of Attention-Deficit/Hyperactivity Disorder (ADHD).   Mr. Haddix reported a history of believing his brain works differently, and that ADHD-related information he found online was similar to his experiences. He further noted that use of online [ADHD evaluation and psychiatric services] didn't pan out, which led to him seeking another ADHD evaluation. He expressed a belief that his ADHD-related symptoms have been occurring since childhood and are most of the time present.  Mr. Gravlin was administered assessments during the evaluation to measure his current cognitive abilities. His verbal comprehension abilities were in the above average range and similarly developed. His ability to sustain attention, concentrate, and exert mental control was in the very high range, suggesting it is a relative strength compared to his verbal reasoning abilities. His WMI performance pattern indicated he has stronger maintenance, refreshing, and/or updating skills relative to the mental manipulation of material, and that he may display better working memory when tasks are somewhat more effortful. It may also indicate a relatively stronger ability to register auditory material when it is presented at a pace closer to the speed of speech than at a slower pace.   During the clinical interview and on self-report measures, Mr. Dooling endorsed executive functioning concerns, including attentional dysregulation and hyperactivity-impulsivity-related symptoms. He ultimately provided indications that he may meet the full criteria for Attention Deficit/Hyperactivity Disorder, Primarily Hyperactivity and Impulsivity presentation. However, he occasionally offered seemingly conflicting information throughout the interview; his PAI results raised validity concerns; and he did not complete the evaluation,  resulting in multiple measures (i.e., the CNS Vital Signs, ASRSv1.1, and BRIEF-2A Informant version) not being completed. The conflicting and missing information, as well as the PAI validity concerns, made it difficult for this evaluator to rule in or out ADHD definitively and to determine if various other factors (e.g., sleep apnea, and trauma-, depression-, and anxiety-related symptoms) may better explain his endorsed ADHD-related symptoms. Given this, a diagnosis of ADHD was not given. He would likely benefit from further evaluation to definitively rule in or out the disorder.   Mr. Schellinger discussed a history of depressive episodes that he is utilizing Wellbutrin  to assist with; generalized anxiety-related symptoms; social anxiety-related symptoms; panic attack-related symptoms; sleep onset and maintenance issues, as well as fatigue for a good portion of the day, which he attributed to anxiety, effects of a diuretic he uses, and diabetes, adding that he uses a CPAP on and off for sleep apnea; overeating- and emotional-related behaviors, noting the aforementioned may have contributed to him becoming diabetic; and occasional thoughts of passive suicidal ideation without plan or intent. The PHQ-9, GAD-7, and PAI were administered. His results suggest he experiences moderately severe depression- and moderate anxiety-related symptoms and has experienced a past traumatic event(s) that continues to be a significant source of distress. When considering his self-reported information, and chart review indicating he meets full criteria for Generalized Anxiety Disorder and Major Depressive Disorder, diagnoses of F41.1 Generalized Anxiety Disorder and F33.1 Major Depressive Disorder, Recurrent, Moderate appear  warranted. Given the limited scope of this evaluation, it was not possible to determine if the full criteria for social anxiety disorder, sleep-wake disorder, and eating disorder are met, or if his diagnoses of  Generalized Anxiety Disorder and Major Depressive Disorder better explain the symptoms. He would likely benefit from further evaluation of these symptoms to definitively rule in or out the aforementioned disorders.    DSM-5 Diagnostic Impressions: F41.1 Generalized Anxiety Disorder  F33.1 Major Depressive Disorder, Recurrent, Moderate   RECOMMENDATIONS: Mr. Mazurowski would likely benefit from further evaluation to definitively rule in or out ADHD. Mr. Gueye would also likely benefit from speaking with his medical care team about his CPAP efficacy and comfort concerns.  Individual therapeutic services may assist in processing his experiences, addressing endorsed mental health-related concerns, and discussing coping and compensatory strategies. Mr. Ewalt would likely benefit from making use of strategies for his endorsed executive functioning-related difficulties:  Setting a timer to complete tasks. Break tasks into manageable chunks and spread them out over more extended periods with breaks.  Utilizing lists and day calendars to keep track of tasks.  Answering emails daily.  Improve listening skills by asking the speaker to give information in smaller chunks and asking for explanations and clarification as needed. Leaving more than the anticipated time to complete tasks. It may help to keep tasks brief, well within his attention span, and a mix of both high and low-interest tasks. Tasks may be gradually increased in length. Practice proactive planning by setting aside time every evening to plan for the next day (e.g., prepare needed materials or pack the car the night before).  Learn how to make a practical and reasonable to-do list of important tasks and priorities and always keep it easily accessible. Make additional copies in case it is lost or misplaced. Utilize visual reminders by posting appointments, to-do lists, or schedules in strategic areas at home and work.  Practice using an  appointment book, smartphone, or other tech device, or a daily planning calendar, and learn to write down appointments and commitments immediately. Keep notepads or use a portable audio recorder to capture important ideas that would be beneficial to recall later. Learn and practice time management skills. Purchase a programmable alarm watch or set an alarm on a smartphone to avoid losing track of time.  Use a color-coded file system, desk and closet organizers, storage boxes, or other organization devices to reduce clutter and improve efficiency and structure.  Implement ways to become more aware of your actions and to inhibit or adjust them as warranted (e.g., reviewing videos of your actions, considering consequences of obeying or not obeying the rules of various upcoming situations, having a trusted other to discuss plans with, and/or provide cues to stop certain behaviors, and make visual cues for rules you would like to follow). The 4Rs: Read just one paragraph, recite out loud in a soft voice or whisper what was important in that material, write that material down in a notebook, then review what you just wrote. Stay flexible and be prepared to change your plans, as symptom breakthroughs and crises will likely occur periodically. Mr. Pullara may benefit from mindfulness training to address symptoms of inattention.  Mental alertness/energy can be raised by increasing exercise; improving sleep; eating a healthy diet; and managing stress. Consulting with a physician regarding any changes to the physical regimen is recommended. Applications:  RescueTime. Tracks your activities on your phone and/or computer to determine how productive you have been and what distracted  you. Free two-week trial.  [email or web address] . It uses engineered audio that may reduce distractions and assist with focus. Free 15-day trial. Freedom. Allows you to highlight days and times you want to block yourself from certain sites or apps. Free  trial. Merrily.  It allows you to input your bank accounts and creates a visual layout of information about your financial goals, budget management, alerts, etc. May offer a free trial. Boomerang. It allows you to schedule times an email is sent and to see if others have received or opened your email. Ten messages free per month and a free trial of the premium version. IFTTT. Uses channels to create various actions (e.g., if you are mentioned in an email, highlight it in your inbox, and if you miss a call, add it to a to-do list). Free and premium versions. [email or web address]. Cleans up your email by unsubscribing from what you do not want to receive while still getting everything you do. Free. Finish. It allows you to divide two-list tasks into short-, mid-, and long-term tasks and determine how much time remains for each. Focus mode hides non-priority tasks.  Autosilent. Turns your phone ringer on and off based on specified calendars, geo-fences, timers, etc. $3.99. Freakyalarm. It makes you solve math problems to disable an alarm. $1.99. Wake N Shake. It makes you vigorously shake your phone to stop the alarm. $.99. Todoist. It allows you to add sub-tasks to tasks and includes email and web plugins to make it work across the system. Premium has location-based reminders, calendar sync, productive tracking, etc.  Sleep Cycle. Utilize your phone's motion sensors to catch movement while you are asleep. The alarm will wake you up as early as 30 minutes before your scheduled wake-up time, based on your lightest sleep phase, and show you how daily activities affect your sleep quality.   Frederic Fire, Psy.D. Licensed Psychologist - [facility] [phone]   References  Freya, R. A. (2021). Taking charge of adult ADHD: proven strategies to succeed at work, at   home, and in relationships (pp. 6-10 and 272-276). [employer].

## 2024-10-27 ENCOUNTER — Other Ambulatory Visit: Payer: Self-pay | Admitting: Family Medicine

## 2024-10-28 ENCOUNTER — Other Ambulatory Visit: Payer: Self-pay | Admitting: Family Medicine

## 2024-11-27 ENCOUNTER — Other Ambulatory Visit: Payer: Self-pay | Admitting: Family Medicine
